# Patient Record
Sex: Female | Born: 1965 | Race: White | Hispanic: No | Marital: Married | Smoking: Never smoker
Health system: Southern US, Community
[De-identification: ages and names within clinical notes are randomized; demographics above are authoritative.]

## PROBLEM LIST (undated history)

## (undated) DIAGNOSIS — R112 Nausea with vomiting, unspecified: Secondary | ICD-10-CM

## (undated) DIAGNOSIS — Z9889 Other specified postprocedural states: Secondary | ICD-10-CM

## (undated) DIAGNOSIS — F419 Anxiety disorder, unspecified: Secondary | ICD-10-CM

## (undated) DIAGNOSIS — M419 Scoliosis, unspecified: Secondary | ICD-10-CM

## (undated) HISTORY — PX: HAND SURGERY: SHX662

## (undated) HISTORY — PX: OTHER SURGICAL HISTORY: SHX169

## (undated) HISTORY — PX: SPINE SURGERY: SHX786

## (undated) HISTORY — PX: WISDOM TOOTH EXTRACTION: SHX21

---

## 1998-05-19 ENCOUNTER — Ambulatory Visit (HOSPITAL_COMMUNITY): Admission: RE | Admit: 1998-05-19 | Discharge: 1998-05-19 | Payer: Self-pay | Admitting: Obstetrics and Gynecology

## 1998-08-16 ENCOUNTER — Inpatient Hospital Stay (HOSPITAL_COMMUNITY): Admission: AD | Admit: 1998-08-16 | Discharge: 1998-08-18 | Payer: Self-pay | Admitting: Obstetrics and Gynecology

## 1999-10-01 ENCOUNTER — Other Ambulatory Visit: Admission: RE | Admit: 1999-10-01 | Discharge: 1999-10-01 | Payer: Self-pay | Admitting: Obstetrics and Gynecology

## 2000-10-02 ENCOUNTER — Other Ambulatory Visit: Admission: RE | Admit: 2000-10-02 | Discharge: 2000-10-02 | Payer: Self-pay | Admitting: Obstetrics and Gynecology

## 2001-06-11 ENCOUNTER — Other Ambulatory Visit: Admission: RE | Admit: 2001-06-11 | Discharge: 2001-06-11 | Payer: Self-pay | Admitting: Obstetrics and Gynecology

## 2001-08-02 ENCOUNTER — Encounter: Payer: Self-pay | Admitting: Obstetrics and Gynecology

## 2001-08-02 ENCOUNTER — Ambulatory Visit (HOSPITAL_COMMUNITY): Admission: RE | Admit: 2001-08-02 | Discharge: 2001-08-02 | Payer: Self-pay | Admitting: Obstetrics and Gynecology

## 2001-12-18 ENCOUNTER — Inpatient Hospital Stay (HOSPITAL_COMMUNITY): Admission: AD | Admit: 2001-12-18 | Discharge: 2001-12-18 | Payer: Self-pay | Admitting: Obstetrics and Gynecology

## 2001-12-25 ENCOUNTER — Inpatient Hospital Stay (HOSPITAL_COMMUNITY): Admission: AD | Admit: 2001-12-25 | Discharge: 2001-12-27 | Payer: Self-pay | Admitting: Obstetrics and Gynecology

## 2001-12-25 ENCOUNTER — Encounter (INDEPENDENT_AMBULATORY_CARE_PROVIDER_SITE_OTHER): Payer: Self-pay | Admitting: Specialist

## 2002-06-25 ENCOUNTER — Other Ambulatory Visit: Admission: RE | Admit: 2002-06-25 | Discharge: 2002-06-25 | Payer: Self-pay | Admitting: Obstetrics and Gynecology

## 2003-07-21 ENCOUNTER — Other Ambulatory Visit: Admission: RE | Admit: 2003-07-21 | Discharge: 2003-07-21 | Payer: Self-pay | Admitting: Obstetrics and Gynecology

## 2004-12-27 ENCOUNTER — Other Ambulatory Visit: Admission: RE | Admit: 2004-12-27 | Discharge: 2004-12-27 | Payer: Self-pay | Admitting: Obstetrics and Gynecology

## 2005-02-16 ENCOUNTER — Ambulatory Visit: Payer: Self-pay | Admitting: Family Medicine

## 2006-03-02 ENCOUNTER — Ambulatory Visit: Payer: Self-pay | Admitting: Family Medicine

## 2006-03-06 ENCOUNTER — Ambulatory Visit: Payer: Self-pay | Admitting: Family Medicine

## 2006-03-15 ENCOUNTER — Ambulatory Visit: Payer: Self-pay | Admitting: Licensed Clinical Social Worker

## 2006-05-16 ENCOUNTER — Other Ambulatory Visit: Admission: RE | Admit: 2006-05-16 | Discharge: 2006-05-16 | Payer: Self-pay | Admitting: Obstetrics and Gynecology

## 2007-03-22 ENCOUNTER — Ambulatory Visit: Payer: Self-pay | Admitting: Family Medicine

## 2007-05-14 ENCOUNTER — Telehealth: Payer: Self-pay | Admitting: *Deleted

## 2008-03-26 ENCOUNTER — Telehealth: Payer: Self-pay | Admitting: Family Medicine

## 2008-05-02 ENCOUNTER — Ambulatory Visit: Payer: Self-pay | Admitting: Family Medicine

## 2008-05-02 DIAGNOSIS — M7710 Lateral epicondylitis, unspecified elbow: Secondary | ICD-10-CM

## 2008-05-02 DIAGNOSIS — IMO0002 Reserved for concepts with insufficient information to code with codable children: Secondary | ICD-10-CM

## 2008-05-02 DIAGNOSIS — F329 Major depressive disorder, single episode, unspecified: Secondary | ICD-10-CM | POA: Insufficient documentation

## 2008-05-02 DIAGNOSIS — K589 Irritable bowel syndrome without diarrhea: Secondary | ICD-10-CM | POA: Insufficient documentation

## 2008-05-12 ENCOUNTER — Ambulatory Visit: Payer: Self-pay | Admitting: Internal Medicine

## 2008-07-15 ENCOUNTER — Ambulatory Visit: Payer: Self-pay | Admitting: Family Medicine

## 2008-07-15 LAB — CONVERTED CEMR LAB
Bilirubin Urine: NEGATIVE
Blood in Urine, dipstick: NEGATIVE
Glucose, Urine, Semiquant: NEGATIVE
Ketones, urine, test strip: NEGATIVE
Nitrite: NEGATIVE
Protein, U semiquant: NEGATIVE
Specific Gravity, Urine: 1.02
Urobilinogen, UA: 0.2
WBC Urine, dipstick: NEGATIVE
pH: 7

## 2008-07-16 LAB — CONVERTED CEMR LAB
ALT: 14 units/L (ref 0–35)
AST: 17 units/L (ref 0–37)
Albumin: 4 g/dL (ref 3.5–5.2)
Alkaline Phosphatase: 43 units/L (ref 39–117)
BUN: 11 mg/dL (ref 6–23)
Basophils Absolute: 0 10*3/uL (ref 0.0–0.1)
Basophils Relative: 0.2 % (ref 0.0–3.0)
Bilirubin, Direct: 0.2 mg/dL (ref 0.0–0.3)
CO2: 30 meq/L (ref 19–32)
Calcium: 9 mg/dL (ref 8.4–10.5)
Chloride: 111 meq/L (ref 96–112)
Cholesterol: 162 mg/dL (ref 0–200)
Creatinine, Ser: 0.8 mg/dL (ref 0.4–1.2)
Eosinophils Absolute: 0.1 10*3/uL (ref 0.0–0.7)
Eosinophils Relative: 1.2 % (ref 0.0–5.0)
GFR calc Af Amer: 102 mL/min
GFR calc non Af Amer: 84 mL/min
Glucose, Bld: 91 mg/dL (ref 70–99)
HCT: 37 % (ref 36.0–46.0)
HDL: 45.9 mg/dL (ref >39.0)
Hemoglobin: 13.2 g/dL (ref 12.0–15.0)
LDL Cholesterol: 100 mg/dL — ABNORMAL HIGH (ref 0–99)
Lymphocytes Relative: 23.9 % (ref 12.0–46.0)
MCHC: 35.6 g/dL (ref 30.0–36.0)
MCV: 92.1 fL (ref 78.0–100.0)
Monocytes Absolute: 0.6 10*3/uL (ref 0.1–1.0)
Monocytes Relative: 10 % (ref 3.0–12.0)
Neutro Abs: 3.5 10*3/uL (ref 1.4–7.7)
Neutrophils Relative %: 64.7 % (ref 43.0–77.0)
Platelets: 172 10*3/uL (ref 150–400)
Potassium: 4.8 meq/L (ref 3.5–5.1)
RBC: 4.01 M/uL (ref 3.87–5.11)
RDW: 12.1 % (ref 11.5–14.6)
Sodium: 141 meq/L (ref 135–145)
TSH: 1.39 microintl units/mL (ref 0.35–5.50)
Total Bilirubin: 0.8 mg/dL (ref 0.3–1.2)
Total CHOL/HDL Ratio: 3.5
Total Protein: 6.9 g/dL (ref 6.0–8.3)
Triglycerides: 80 mg/dL (ref 0–149)
VLDL: 16 mg/dL (ref 0–40)
WBC: 5.5 10*3/uL (ref 4.5–10.5)

## 2008-11-14 ENCOUNTER — Ambulatory Visit: Payer: Self-pay | Admitting: Family Medicine

## 2008-11-14 DIAGNOSIS — J029 Acute pharyngitis, unspecified: Secondary | ICD-10-CM

## 2008-12-03 ENCOUNTER — Ambulatory Visit: Payer: Self-pay | Admitting: Family Medicine

## 2008-12-03 LAB — CONVERTED CEMR LAB: Rapid Strep: NEGATIVE

## 2008-12-08 LAB — CONVERTED CEMR LAB
Basophils Absolute: 0 10*3/uL (ref 0.0–0.1)
Basophils Relative: 0.2 % (ref 0.0–3.0)
EBV NA IgG: 4.38 — ABNORMAL HIGH
EBV VCA IgG: 4.27 — ABNORMAL HIGH
EBV VCA IgM: 0.15
Eosinophils Absolute: 0.1 10*3/uL (ref 0.0–0.7)
Eosinophils Relative: 1.7 % (ref 0.0–5.0)
HCT: 39 % (ref 36.0–46.0)
Hemoglobin: 13.4 g/dL (ref 12.0–15.0)
Lymphocytes Relative: 27.3 % (ref 12.0–46.0)
MCHC: 34.4 g/dL (ref 30.0–36.0)
MCV: 91.1 fL (ref 78.0–100.0)
Monocytes Absolute: 0.6 10*3/uL (ref 0.1–1.0)
Monocytes Relative: 8.5 % (ref 3.0–12.0)
Neutro Abs: 4.2 10*3/uL (ref 1.4–7.7)
Neutrophils Relative %: 62.3 % (ref 43.0–77.0)
Platelets: 195 10*3/uL (ref 150–400)
RBC: 4.28 M/uL (ref 3.87–5.11)
RDW: 11.6 % (ref 11.5–14.6)
WBC: 6.7 10*3/uL (ref 4.5–10.5)

## 2009-09-03 ENCOUNTER — Encounter (INDEPENDENT_AMBULATORY_CARE_PROVIDER_SITE_OTHER): Payer: Self-pay | Admitting: *Deleted

## 2009-11-21 ENCOUNTER — Emergency Department (HOSPITAL_COMMUNITY): Admission: EM | Admit: 2009-11-21 | Discharge: 2009-11-21 | Payer: Self-pay | Admitting: Emergency Medicine

## 2009-11-26 ENCOUNTER — Ambulatory Visit (HOSPITAL_BASED_OUTPATIENT_CLINIC_OR_DEPARTMENT_OTHER): Admission: RE | Admit: 2009-11-26 | Discharge: 2009-11-26 | Payer: Self-pay | Admitting: Orthopedic Surgery

## 2009-12-01 ENCOUNTER — Emergency Department (HOSPITAL_COMMUNITY): Admission: EM | Admit: 2009-12-01 | Discharge: 2009-12-01 | Payer: Self-pay | Admitting: Emergency Medicine

## 2010-03-30 ENCOUNTER — Telehealth: Payer: Self-pay | Admitting: Internal Medicine

## 2010-07-06 ENCOUNTER — Ambulatory Visit: Payer: Self-pay | Admitting: Family Medicine

## 2010-07-06 DIAGNOSIS — S0003XA Contusion of scalp, initial encounter: Secondary | ICD-10-CM

## 2010-07-06 DIAGNOSIS — S1093XA Contusion of unspecified part of neck, initial encounter: Secondary | ICD-10-CM

## 2010-07-06 DIAGNOSIS — G4700 Insomnia, unspecified: Secondary | ICD-10-CM | POA: Insufficient documentation

## 2010-07-06 DIAGNOSIS — S0083XA Contusion of other part of head, initial encounter: Secondary | ICD-10-CM

## 2010-07-06 DIAGNOSIS — S060X9A Concussion with loss of consciousness of unspecified duration, initial encounter: Secondary | ICD-10-CM

## 2010-11-23 NOTE — Assessment & Plan Note (Signed)
Summary: PT HIT HEAD SUNDAY/NOT FEELING/CJR   Vital Signs:  Patient profile:   45 year old female Weight:      167 pounds O2 Sat:      98 % Temp:     99 .5 degrees F Pulse rate:   92 / minute BP sitting:   110 / 76  (left arm)  Vitals Entered By: Pura Spice, RN (July 06, 2010 3:16 PM) CC: hit left frontal head on car door sunday. No LOC but doesn't feel well   History of Present Illness: Here for 2 reasons. First 3 days ago while getting into her car she struck her forehead on the edge of the door. It did not knock her out, but she felt lightheaded for a few minutes. Since then she has had a slight HA and has felt slightly off balance. No vision changes or nausea. No other neurologic deficits. She hasbben working and driving this week. Second, she has been under a lot of stress over the past year. She is separated from her husband, and she has felt quite anxious. She has had trouble sleeping and would like some help with this.   Allergies: 1)  ! Sulf-10  Past History:  Past Medical History: Reviewed history from 05/02/2008 and no changes required. IBS Lt foot fracture Depression  Review of Systems  The patient denies anorexia, fever, weight loss, weight gain, vision loss, decreased hearing, hoarseness, chest pain, syncope, dyspnea on exertion, peripheral edema, prolonged cough, hemoptysis, abdominal pain, melena, hematochezia, severe indigestion/heartburn, hematuria, incontinence, genital sores, muscle weakness, suspicious skin lesions, transient blindness, difficulty walking, depression, unusual weight change, abnormal bleeding, enlarged lymph nodes, angioedema, breast masses, and testicular masses.    Physical Exam  General:  Well-developed,well-nourished,in no acute distress; alert,appropriate and cooperative throughout examination Head:  small tender hematoma on the left forehead Eyes:  No corneal or conjunctival inflammation noted. EOMI. Perrla. Funduscopic exam  benign, without hemorrhages, exudates or papilledema. Vision grossly normal. Ears:  External ear exam shows no significant lesions or deformities.  Otoscopic examination reveals clear canals, tympanic membranes are intact bilaterally without bulging, retraction, inflammation or discharge. Hearing is grossly normal bilaterally. Nose:  External nasal examination shows no deformity or inflammation. Nasal mucosa are pink and moist without lesions or exudates. Mouth:  Oral mucosa and oropharynx without lesions or exudates.  Teeth in good repair. Neck:  No deformities, masses, or tenderness noted. Neurologic:  No cranial nerve deficits noted. Station and gait are normal. Plantar reflexes are down-going bilaterally. DTRs are symmetrical throughout. Sensory, motor and coordinative functions appear intact. Psych:  Cognition and judgment appear intact. Alert and cooperative with normal attention span and concentration. No apparent delusions, illusions, hallucinations   Impression & Recommendations:  Problem # 1:  INSOMNIA (ICD-780.52)  Her updated medication list for this problem includes:    Temazepam 15 Mg Caps (Temazepam) .Marland Kitchen... At bedtime  Complete Medication List: 1)  Temazepam 15 Mg Caps (Temazepam) .... At bedtime  Patient Instructions: 1)  She has had a mild concussion. She should do well if she takes it easy this week. Follow up as needed . Try Temazepam for sleep. Prescriptions: TEMAZEPAM 15 MG CAPS (TEMAZEPAM) at bedtime  #30 x 2   Entered and Authorized by:   Nelwyn Salisbury MD   Signed by:   Nelwyn Salisbury MD on 07/06/2010   Method used:   Print then Give to Patient   RxID:   [id number]

## 2010-11-23 NOTE — Progress Notes (Signed)
Summary: new rx   Phone Note Call from Patient Call back at Work Phone [phone]   Caller: Patient--live call Summary of Call: pt says that she is on xanax low dose. please call a new rx to [facility] on [address]. pt is aware that Dr Clent Ridges is out. She wants to know if any doctor could fill this. Please call to let her know yes or no.  Initial call taken by: Warnell Forester,  March 30, 2010 4:09 PM  Follow-up for Phone Call        review of record shows she has not  had an OV  isn over a year and xanax is not on her med list . Perhaps she got thisf roma another doctor.    she needs appt with Dr Clent Ridges  to discuss this Follow-up by: Madelin Headings MD,  March 31, 2010 3:10 PM  Additional Follow-up for Phone Call Additional follow up Details #1::        Phone Call Completed Additional Follow-up by: Raechel Ache, RN,  March 31, 2010 3:24 PM

## 2011-01-13 LAB — POCT HEMOGLOBIN-HEMACUE: Hemoglobin: 14 g/dL (ref 12.0–15.0)

## 2011-01-18 ENCOUNTER — Ambulatory Visit (INDEPENDENT_AMBULATORY_CARE_PROVIDER_SITE_OTHER): Payer: BC Managed Care – PPO | Admitting: Family Medicine

## 2011-01-18 ENCOUNTER — Encounter: Payer: Self-pay | Admitting: Family Medicine

## 2011-01-18 VITALS — BP 110/64 | HR 100 | Temp 98.7°F

## 2011-01-18 DIAGNOSIS — IMO0002 Reserved for concepts with insufficient information to code with codable children: Secondary | ICD-10-CM

## 2011-01-18 MED ORDER — DOXYCYCLINE HYCLATE 100 MG PO CAPS: 100.0000 mg | ORAL_CAPSULE | Freq: Two times a day (BID) | ORAL | Status: AC

## 2011-01-18 NOTE — Progress Notes (Signed)
  Subjective:    Patient ID: Karen Flores, female    DOB: [birth year], 45 y.o.   MRN: [id number]  HPI Here for 4 weeks of recurrent infections around her fingernails. The first area involved was the left 3rd finger, where the skin at the base of the nail became swollen, red, and painful. She went to a [facility] and was given 5 days of Clindamycin. This did not help, and the finger has been smoldering ever since. Then one week ago the same thing happened tho the right 3rd finger. She went to the [facility] again this morning and was given a Zpack. She is here to get a second opinion from me.    Review of Systems  Constitutional: Negative.   Musculoskeletal: Positive for joint swelling.  Skin: Positive for color change.       Objective:   Physical Exam  Constitutional: She appears well-developed and well-nourished.  Skin:       The skin at the base of both 3rd fingernails is red, swollen, and tender. The left 3rd nail is distorted and has a scooped out appearance           Assessment & Plan:  Do not take the Zpack, use Doxycycline instead. Warm soaks.

## 2011-03-11 NOTE — Discharge Summary (Signed)
[facility]  Patient:    Karen Flores, Karen Flores Visit Number: [id number] MRN: [id number]          Service Type: OBS Location: [id number] Attending Physician:  Oliver Pila Dictated by:   Malachi Pro. Ambrose Mantle, M.D. Admit Date:  12/25/2001 Discharge Date: 12/27/2001                             Discharge Summary  HISTORY OF PRESENT ILLNESS:  The patient is a 45 year old white female, para 1-0-0-1, gravida 2, at 39+ weeks gestation with an estimated date of confinement of 12/29/01, presented to labor and delivery with contractions every three minutes for two hours.  Pregnancy was uncomplicated.  She had a history of mild shoulder dystocia.  LABORATORY DATA:  Blood group and type was A+ with a negative antibody, nonreactive serology, rubella immune.  Hepatitis B surface antigen negative. HIV negative.  GC and chlamydia negative.  Triple screen normal.  Group B Strep negative.  PAST OBSTETRICAL HISTORY:  Low forceps delivery in 1999, with an 8 pound 1 ounce infant with mild shoulder dystocia.  PAST GYNECOLOGICAL HISTORY:  Negative.  PAST SURGICAL HISTORY:  Broken arm.  PAST MEDICAL HISTORY:  Irritable bowel.  ADMISSION PHYSICAL EXAMINATION:  VITAL SIGNS:  Normal.  Fetal heart tones were reassuring.  Contractions were every 3 minutes.  PELVIC:  Cervix was 9 cm, with a bulging bag on admission.  HOSPITAL COURSE:  Artificial rupture of membranes was done on admission with clear fluid.  At 10 cm dilatation, the patient began pushing, and told Dr. Senaida Ores that she was very uncomfortable and could not go on pushing.  She was given an epidural, and with the epidural the vertex descended.  She brought the vertex to the perineum in a LOA position.  She stalled with lots of capit showing, and stated "I want the baby out."  A left medial lateral episiotomy in the old scar was done, and with one contraction the patient delivered a living female infant 8  pounds 2 ounces, with Apgars of 8 at one and 9 at five minutes.  There was a tight loop of nuchal cord.  The placenta was intact, uterus normal, rectal negative, left medial lateral episiotomy repaired with 2-0 Vicryl.  Blood loss was about 400 cc.  Dr. Ambrose Mantle was in attendance.  Postpartum the patient did very well, and was discharged on the second postpartum day.  Initial hemoglobin was 10.8, hematocrit 31.2, white blood cell count 8300, platelet count 153,000.  Followup hemoglobin 9.2, hematocrit 26,500, white blood cell count 9700, platelet count 132,000.  RPR was nonreactive.  FINAL DIAGNOSIS:  Intrauterine pregnancy at 39+ weeks, delivered LOA.  OPERATIONS: 1. Spontaneous delivery LOA. 2. Left medial lateral episiotomy and repair.  FINAL CONDITION:  Improved.  DISCHARGE INSTRUCTIONS:  Our regular discharge instruction booklet.  FOLLOWUP:  The patient is advised to return in six weeks for follow-up examination.  She declines analgesics at discharge. Dictated by:   Malachi Pro. Ambrose Mantle, M.D. Attending Physician:  Oliver Pila DD:  12/27/01 TD:  12/28/01 Job: [id number]

## 2011-03-11 NOTE — Assessment & Plan Note (Signed)
[facility]                                   ON-CALL NOTE   Flores, Karen                       MRN:          [id number]  DATE:08/12/2006                            DOB:          1966-08-25    DATE AND TIME OF CALL:  August 12, 2006 at approximately 8:30   PRIMARY CARE DOCTOR:  Dr. Clent Ridges   PHONE NUMBER:  [phone]   SUBJECTIVE:  Karen Flores states that she fell last evening on one of her  feet and since that time she has had pain in her foot and difficulty  weightbearing.  When the injury occurred, she heard a pop.   ASSESSMENT:  The patient was offered an appointment at 11 o'clock at the  Urgent Care.  Because we have to send her for x-rays, she is also going to  consider going to a different Urgent Care where they have x-rays on site.  We will hold an appointment for her at 13 though.       Kerby Nora, MD      AB/MedQ  DD:  08/12/2006  DT:  08/14/2006  Job #:  [id number]   cc:   Tera Mater. Clent Ridges, MD

## 2011-11-14 MED ORDER — SCOPOLAMINE 1 MG/3DAYS TD PT72
MEDICATED_PATCH | TRANSDERMAL | Status: AC
  Filled 2011-11-14: qty 1

## 2011-11-14 MED ORDER — KETOROLAC TROMETHAMINE 60 MG/2ML IM SOLN
INTRAMUSCULAR | Status: AC
  Filled 2011-11-14: qty 2

## 2011-11-15 ENCOUNTER — Encounter (HOSPITAL_COMMUNITY): Payer: Self-pay | Admitting: Pharmacist

## 2011-11-23 ENCOUNTER — Encounter (HOSPITAL_COMMUNITY): Payer: Self-pay

## 2011-11-23 ENCOUNTER — Encounter (HOSPITAL_COMMUNITY)
Admission: RE | Admit: 2011-11-23 | Discharge: 2011-11-23 | Disposition: A | Discharge status: Home / Self Care | Payer: BC Managed Care – PPO | Source: Ambulatory Visit | Attending: Obstetrics and Gynecology | Admitting: Obstetrics and Gynecology

## 2011-11-23 HISTORY — DX: Other specified postprocedural states: Z98.890

## 2011-11-23 HISTORY — DX: Anxiety disorder, unspecified: F41.9

## 2011-11-23 HISTORY — DX: Other specified postprocedural states: R11.2

## 2011-11-23 HISTORY — DX: Scoliosis, unspecified: M41.9

## 2011-11-23 HISTORY — DX: Nausea with vomiting, unspecified: Z98.890

## 2011-11-23 LAB — CBC
MCV: 89.7 fL (ref 78.0–100.0)
Platelets: 215 10*3/uL (ref 150–400)
RBC: 4.38 MIL/uL (ref 3.87–5.11)
WBC: 6.2 10*3/uL (ref 4.0–10.5)

## 2011-11-23 NOTE — Patient Instructions (Addendum)
    Your procedure is scheduled on: TUESDAY, FEB. 5TH  Enter through the Main Entrance of [facility] at:  6am Pick up the phone at the desk and dial [phone] and inform us of your arrival.  Please call this number if you have any problems the morning of surgery: [phone]  Remember: Do not eat food after midnight: Monday Do not drink clear liquids after: Monday Take these medicines the morning of surgery with a SIP OF WATER: Xanax  Do not wear jewelry, make-up, or FINGER nail polish Do not wear lotions, powders, perfumes or deodorant. Do not shave 48 hours prior to surgery. Do not bring valuables to the hospital.  Leave suitcase in the car. After Surgery it may be brought to your room. For patients being admitted to the hospital, checkout time is 11:00am the day of discharge. Home with Mother Ernest Haber   Patients discharged on the day of surgery will not be allowed to drive home.     Remember to use your hibiclens as instructed.Please shower with 1/2 bottle the evening before your surgery and the other 1/2 bottle the morning of surgery.

## 2011-11-23 NOTE — Pre-Procedure Instructions (Signed)
Ok to see patient DOS. 

## 2011-11-29 ENCOUNTER — Encounter (HOSPITAL_COMMUNITY): Payer: Self-pay | Admitting: Anesthesiology

## 2011-11-29 ENCOUNTER — Encounter (HOSPITAL_COMMUNITY): Payer: Self-pay | Admitting: *Deleted

## 2011-11-29 ENCOUNTER — Ambulatory Visit (HOSPITAL_COMMUNITY)
Admission: RE | Admit: 2011-11-29 | Discharge: 2011-11-30 | Disposition: A | Discharge status: Home / Self Care | Payer: BC Managed Care – PPO | Source: Ambulatory Visit | Attending: Obstetrics and Gynecology | Admitting: Obstetrics and Gynecology

## 2011-11-29 ENCOUNTER — Other Ambulatory Visit: Payer: Self-pay | Admitting: Obstetrics and Gynecology

## 2011-11-29 ENCOUNTER — Encounter (HOSPITAL_COMMUNITY)
Admission: RE | Disposition: A | Discharge status: Home / Self Care | Payer: Self-pay | Source: Ambulatory Visit | Attending: Obstetrics and Gynecology

## 2011-11-29 ENCOUNTER — Ambulatory Visit (HOSPITAL_COMMUNITY): Payer: BC Managed Care – PPO | Admitting: Anesthesiology

## 2011-11-29 DIAGNOSIS — Z01812 Encounter for preprocedural laboratory examination: Secondary | ICD-10-CM | POA: Insufficient documentation

## 2011-11-29 DIAGNOSIS — D259 Leiomyoma of uterus, unspecified: Secondary | ICD-10-CM | POA: Insufficient documentation

## 2011-11-29 DIAGNOSIS — D219 Benign neoplasm of connective and other soft tissue, unspecified: Secondary | ICD-10-CM

## 2011-11-29 DIAGNOSIS — Z01818 Encounter for other preprocedural examination: Secondary | ICD-10-CM | POA: Insufficient documentation

## 2011-11-29 HISTORY — PX: LAPAROSCOPIC SUPRACERVICAL HYSTERECTOMY: SHX5399

## 2011-11-29 LAB — CBC
MCHC: 33.3 g/dL (ref 30.0–36.0)
MCV: 89.6 fL (ref 78.0–100.0)
Platelets: 157 10*3/uL (ref 150–400)
RDW: 12.6 % (ref 11.5–15.5)
WBC: 7.2 10*3/uL (ref 4.0–10.5)

## 2011-11-29 LAB — HCG, QUANTITATIVE, PREGNANCY: hCG, Beta Chain, Quant, S: <1 m[IU]/mL (ref <5)

## 2011-11-29 SURGERY — HYSTERECTOMY, SUPRACERVICAL, LAPAROSCOPIC
Anesthesia: General | Site: Abdomen | Wound class: Clean Contaminated

## 2011-11-29 MED ORDER — MIDAZOLAM HCL 5 MG/5ML IJ SOLN
INTRAMUSCULAR | Status: DC | PRN
  Administered 2011-11-29: 2 mg via INTRAVENOUS

## 2011-11-29 MED ORDER — FENTANYL CITRATE 0.05 MG/ML IJ SOLN
INTRAMUSCULAR | Status: AC
  Filled 2011-11-29: qty 2

## 2011-11-29 MED ORDER — ROCURONIUM BROMIDE 50 MG/5ML IV SOLN
INTRAVENOUS | Status: AC
  Filled 2011-11-29: qty 1

## 2011-11-29 MED ORDER — ALUM & MAG HYDROXIDE-SIMETH 200-200-20 MG/5ML PO SUSP
30.0000 mL | ORAL | Status: DC | PRN
  Administered 2011-11-29: 30 mL via ORAL
  Filled 2011-11-29: qty 30

## 2011-11-29 MED ORDER — GLYCOPYRROLATE 0.2 MG/ML IJ SOLN
INTRAMUSCULAR | Status: DC | PRN
  Administered 2011-11-29: .8 mg via INTRAVENOUS
  Administered 2011-11-29: 0.1 mg via INTRAVENOUS
  Administered 2011-11-29: 0.2 mg via INTRAVENOUS

## 2011-11-29 MED ORDER — SIMETHICONE 80 MG PO CHEW
80.0000 mg | CHEWABLE_TABLET | Freq: Four times a day (QID) | ORAL | Status: DC | PRN
  Administered 2011-11-29: 80 mg via ORAL

## 2011-11-29 MED ORDER — GLYCOPYRROLATE 0.2 MG/ML IJ SOLN
INTRAMUSCULAR | Status: AC
  Filled 2011-11-29: qty 2

## 2011-11-29 MED ORDER — SCOPOLAMINE 1 MG/3DAYS TD PT72
1.0000 | MEDICATED_PATCH | Freq: Once | TRANSDERMAL | Status: DC
  Administered 2011-11-29: 1.5 mg via TRANSDERMAL

## 2011-11-29 MED ORDER — KETOROLAC TROMETHAMINE 30 MG/ML IJ SOLN: 30.0000 mg | Freq: Four times a day (QID) | INTRAMUSCULAR | Status: DC

## 2011-11-29 MED ORDER — DEXAMETHASONE SODIUM PHOSPHATE 10 MG/ML IJ SOLN
INTRAMUSCULAR | Status: AC
  Filled 2011-11-29: qty 1

## 2011-11-29 MED ORDER — HYDROMORPHONE HCL PF 1 MG/ML IJ SOLN
INTRAMUSCULAR | Status: AC
  Filled 2011-11-29: qty 1

## 2011-11-29 MED ORDER — PANTOPRAZOLE SODIUM 40 MG PO TBEC
40.0000 mg | DELAYED_RELEASE_TABLET | Freq: Once | ORAL | Status: AC
  Administered 2011-11-29: 40 mg via ORAL

## 2011-11-29 MED ORDER — NEOSTIGMINE METHYLSULFATE 1 MG/ML IJ SOLN
INTRAMUSCULAR | Status: AC
  Filled 2011-11-29: qty 10

## 2011-11-29 MED ORDER — ONDANSETRON HCL 4 MG/2ML IJ SOLN
INTRAMUSCULAR | Status: DC | PRN
  Administered 2011-11-29: 4 mg via INTRAVENOUS

## 2011-11-29 MED ORDER — LACTATED RINGERS IV SOLN
INTRAVENOUS | Status: DC
  Administered 2011-11-29 (×3): via INTRAVENOUS

## 2011-11-29 MED ORDER — OXYCODONE-ACETAMINOPHEN 5-325 MG PO TABS
1.0000 | ORAL_TABLET | ORAL | Status: DC | PRN
  Administered 2011-11-29 (×3): 2 via ORAL
  Administered 2011-11-30: 1 via ORAL
  Filled 2011-11-29 (×4): qty 2

## 2011-11-29 MED ORDER — PANTOPRAZOLE SODIUM 40 MG PO TBEC
DELAYED_RELEASE_TABLET | ORAL | Status: AC
  Filled 2011-11-29: qty 1

## 2011-11-29 MED ORDER — PROPOFOL 10 MG/ML IV EMUL
INTRAVENOUS | Status: AC
  Filled 2011-11-29: qty 20

## 2011-11-29 MED ORDER — DEXAMETHASONE SODIUM PHOSPHATE 10 MG/ML IJ SOLN
INTRAMUSCULAR | Status: DC | PRN
  Administered 2011-11-29: 10 mg via INTRAVENOUS

## 2011-11-29 MED ORDER — LIDOCAINE HCL (CARDIAC) 20 MG/ML IV SOLN
INTRAVENOUS | Status: DC | PRN
  Administered 2011-11-29: 60 mg via INTRAVENOUS

## 2011-11-29 MED ORDER — CEFAZOLIN SODIUM 1-5 GM-% IV SOLN: 1.0000 g | INTRAVENOUS | Status: DC

## 2011-11-29 MED ORDER — NEOSTIGMINE METHYLSULFATE 1 MG/ML IJ SOLN
INTRAMUSCULAR | Status: DC | PRN
  Administered 2011-11-29: 4 mg via INTRAVENOUS

## 2011-11-29 MED ORDER — BUPIVACAINE HCL (PF) 0.25 % IJ SOLN
INTRAMUSCULAR | Status: AC
  Filled 2011-11-29: qty 30

## 2011-11-29 MED ORDER — ALPRAZOLAM 0.25 MG PO TABS: 0.2500 mg | ORAL_TABLET | Freq: Every day | ORAL | Status: DC | PRN

## 2011-11-29 MED ORDER — SODIUM CHLORIDE 0.9 % IJ SOLN
INTRAMUSCULAR | Status: DC | PRN
  Administered 2011-11-29: 10 mL

## 2011-11-29 MED ORDER — MIDAZOLAM HCL 2 MG/2ML IJ SOLN
INTRAMUSCULAR | Status: AC
  Filled 2011-11-29: qty 2

## 2011-11-29 MED ORDER — HYDROMORPHONE HCL PF 1 MG/ML IJ SOLN
0.2500 mg | INTRAMUSCULAR | Status: DC | PRN
  Administered 2011-11-29: 50 mg via INTRAVENOUS
  Administered 2011-11-29: 11:00:00 via INTRAVENOUS

## 2011-11-29 MED ORDER — LACTATED RINGERS IR SOLN
Status: DC | PRN
  Administered 2011-11-29: 3000 mL

## 2011-11-29 MED ORDER — FENTANYL CITRATE 0.05 MG/ML IJ SOLN
INTRAMUSCULAR | Status: DC | PRN
  Administered 2011-11-29: 100 ug via INTRAVENOUS
  Administered 2011-11-29 (×5): 50 ug via INTRAVENOUS

## 2011-11-29 MED ORDER — ONDANSETRON HCL 4 MG PO TABS: 4.0000 mg | ORAL_TABLET | Freq: Four times a day (QID) | ORAL | Status: DC | PRN

## 2011-11-29 MED ORDER — LIDOCAINE HCL (CARDIAC) 20 MG/ML IV SOLN
INTRAVENOUS | Status: AC
  Filled 2011-11-29: qty 5

## 2011-11-29 MED ORDER — ONDANSETRON HCL 4 MG/2ML IJ SOLN: 4.0000 mg | Freq: Four times a day (QID) | INTRAMUSCULAR | Status: DC | PRN

## 2011-11-29 MED ORDER — HYDROMORPHONE HCL PF 1 MG/ML IJ SOLN
INTRAMUSCULAR | Status: DC | PRN
  Administered 2011-11-29 (×2): 1 mg via INTRAVENOUS

## 2011-11-29 MED ORDER — DEXTROSE-NACL 5-0.45 % IV SOLN
INTRAVENOUS | Status: DC
  Administered 2011-11-29 – 2011-11-30 (×2): via INTRAVENOUS

## 2011-11-29 MED ORDER — KETOROLAC TROMETHAMINE 30 MG/ML IJ SOLN
30.0000 mg | Freq: Four times a day (QID) | INTRAMUSCULAR | Status: DC
  Administered 2011-11-29 – 2011-11-30 (×3): 30 mg via INTRAVENOUS
  Filled 2011-11-29 (×3): qty 1

## 2011-11-29 MED ORDER — ONDANSETRON HCL 4 MG/2ML IJ SOLN
INTRAMUSCULAR | Status: AC
  Filled 2011-11-29: qty 2

## 2011-11-29 MED ORDER — INFLUENZA VIRUS VACC SPLIT PF IM SUSP: 0.5000 mL | INTRAMUSCULAR | Status: DC | PRN

## 2011-11-29 MED ORDER — FENTANYL CITRATE 0.05 MG/ML IJ SOLN
INTRAMUSCULAR | Status: AC
  Filled 2011-11-29: qty 5

## 2011-11-29 MED ORDER — BUPIVACAINE HCL (PF) 0.25 % IJ SOLN
INTRAMUSCULAR | Status: DC | PRN
  Administered 2011-11-29: 10 mL

## 2011-11-29 MED ORDER — SCOPOLAMINE 1 MG/3DAYS TD PT72
MEDICATED_PATCH | TRANSDERMAL | Status: AC
  Filled 2011-11-29: qty 1

## 2011-11-29 MED ORDER — CEFAZOLIN SODIUM 1-5 GM-% IV SOLN
INTRAVENOUS | Status: AC
  Administered 2011-11-29: 1 g via INTRAVENOUS
  Filled 2011-11-29: qty 50

## 2011-11-29 MED ORDER — PROPOFOL 10 MG/ML IV EMUL
INTRAVENOUS | Status: DC | PRN
  Administered 2011-11-29: 150 mg via INTRAVENOUS

## 2011-11-29 MED ORDER — KETOROLAC TROMETHAMINE 30 MG/ML IJ SOLN
INTRAMUSCULAR | Status: AC
  Filled 2011-11-29: qty 1

## 2011-11-29 MED ORDER — KETOROLAC TROMETHAMINE 30 MG/ML IJ SOLN
INTRAMUSCULAR | Status: DC | PRN
  Administered 2011-11-29: 30 mg via INTRAVENOUS

## 2011-11-29 MED ORDER — ROCURONIUM BROMIDE 100 MG/10ML IV SOLN
INTRAVENOUS | Status: DC | PRN
  Administered 2011-11-29: 40 mg via INTRAVENOUS
  Administered 2011-11-29: 10 mg via INTRAVENOUS
  Administered 2011-11-29: 20 mg via INTRAVENOUS
  Administered 2011-11-29: 10 mg via INTRAVENOUS

## 2011-11-29 SURGICAL SUPPLY — 33 items
ADH SKN CLS APL DERMABOND .7 (GAUZE/BANDAGES/DRESSINGS) ×1
BARRIER ADHS 3X4 INTERCEED (GAUZE/BANDAGES/DRESSINGS) ×1 IMPLANT
BLADE LAPAROSCOPIC MORCELL KIT (BLADE) ×2 IMPLANT
BLADE SURG 10 STRL SS (BLADE) ×1 IMPLANT
BRR ADH 4X3 ABS CNTRL BYND (GAUZE/BANDAGES/DRESSINGS) ×1
CABLE HIGH FREQUENCY MONO STRZ (ELECTRODE) IMPLANT
CHLORAPREP W/TINT 26ML (MISCELLANEOUS) ×2 IMPLANT
CLOTH BEACON ORANGE TIMEOUT ST (SAFETY) ×2 IMPLANT
COVER MAYO STAND STRL (DRAPES) ×2 IMPLANT
DERMABOND ADVANCED (GAUZE/BANDAGES/DRESSINGS) ×1
DERMABOND ADVANCED .7 DNX12 (GAUZE/BANDAGES/DRESSINGS) ×1 IMPLANT
EVACUATOR SMOKE 8.L (FILTER) ×4 IMPLANT
GLOVE BIO SURGEON STRL SZ8 (GLOVE) ×2 IMPLANT
GLOVE ORTHO TXT STRL SZ7.5 (GLOVE) ×2 IMPLANT
GOWN PREVENTION PLUS LG XLONG (DISPOSABLE) ×4 IMPLANT
HEMOSTAT SURGICEL 2X3 (HEMOSTASIS) ×1 IMPLANT
NDL INSUFFLATION 14GA 120MM (NEEDLE) ×1 IMPLANT
NEEDLE INSUFFLATION 14GA 120MM (NEEDLE) ×2 IMPLANT
NS IRRIG 1000ML POUR BTL (IV SOLUTION) ×2 IMPLANT
PACK LAPAROSCOPY BASIN (CUSTOM PROCEDURE TRAY) ×2 IMPLANT
SCALPEL HARMONIC ACE (MISCELLANEOUS) ×1 IMPLANT
SET IRRIG TUBING LAPAROSCOPIC (IRRIGATION / IRRIGATOR) ×2 IMPLANT
SLEEVE Z-THREAD 5X100MM (TROCAR) ×2 IMPLANT
SOLUTION ELECTROLUBE (MISCELLANEOUS) ×1 IMPLANT
SUT VIC AB 3-0 PS2 18 (SUTURE) ×2
SUT VIC AB 3-0 PS2 18XBRD (SUTURE) ×1 IMPLANT
SUT VICRYL 0 UR6 27IN ABS (SUTURE) ×2 IMPLANT
TOWEL OR 17X24 6PK STRL BLUE (TOWEL DISPOSABLE) ×4 IMPLANT
TRAY FOLEY CATH 14FR (SET/KITS/TRAYS/PACK) ×2 IMPLANT
TROCAR Z-THREAD FIOS 12X100MM (TROCAR) ×2 IMPLANT
TROCAR Z-THREAD FIOS 5X100MM (TROCAR) ×2 IMPLANT
WARMER LAPAROSCOPE (MISCELLANEOUS) ×2 IMPLANT
WATER STERILE IRR 1000ML POUR (IV SOLUTION) ×2 IMPLANT

## 2011-11-29 NOTE — Progress Notes (Addendum)
Post-op check, LSH Doing ok, moderate pain, some nausea Afeb, VSS Abd- soft, incisions ok Hgb 13.1 to 10.9 Will keep overnight for pain control, recheck CBC in am

## 2011-11-29 NOTE — Anesthesia Procedure Notes (Signed)
Procedure Name: Intubation Date/Time: 11/29/2011 7:30 AM Performed by: Karleen Dolphin Pre-anesthesia Checklist: Timeout performed, Patient identified, Emergency Drugs available, Suction available and Patient being monitored Patient Re-evaluated:Patient Re-evaluated prior to inductionOxygen Delivery Method: Circle System Utilized Preoxygenation: Pre-oxygenation with 100% oxygen Intubation Type: IV induction Ventilation: Mask ventilation without difficulty Laryngoscope Size: Mac and 3 Grade View: Grade I Tube type: Oral Tube size: 7.0 mm Number of attempts: 1 Airway Equipment and Method: stylet Placement Confirmation: ETT inserted through vocal cords under direct vision,  breath sounds checked- equal and bilateral and positive ETCO2 Secured at: 21 cm Tube secured with: Tape Dental Injury: Teeth and Oropharynx as per pre-operative assessment

## 2011-11-29 NOTE — Anesthesia Postprocedure Evaluation (Signed)
Anesthesia Post Note  Patient: Karen Flores  Procedure(s) Performed:  LAPAROSCOPIC SUPRACERVICAL HYSTERECTOMY  Anesthesia type: GA  Patient location: PACU  Post pain: Pain level controlled  Post assessment: Post-op Vital signs reviewed  Last Vitals:  Filed Vitals:   11/29/11 1020  BP: 103/54  Pulse: 67  Temp: 36.6 C  Resp: 14    Post vital signs: Reviewed  Level of consciousness: sedated  Complications: No apparent anesthesia complications

## 2011-11-29 NOTE — Anesthesia Postprocedure Evaluation (Signed)
  Anesthesia Post-op Note  Patient: Karen Flores  Procedure(s) Performed:  LAPAROSCOPIC SUPRACERVICAL HYSTERECTOMY  Patient Location: Women's Unit  Anesthesia Type: General  Level of Consciousness: awake, alert  and oriented  Airway and Oxygen Therapy: Patient Spontanous Breathing  Post-op Pain: mild  Post-op Assessment: Patient's Cardiovascular Status Stable, Respiratory Function Stable, Patent Airway, No signs of Nausea or vomiting and Pain level controlled  Post-op Vital Signs: stable  Complications: No apparent anesthesia complications

## 2011-11-29 NOTE — H&P (Signed)
Karen Flores is an 46 y.o. female, P 2012, with a symptomatic fibroid admitted for surgical therapy. She was seen for an annual exam this past October, was found to have an enlarged uterus pushing on her bladder.  Pelvic ultrasound confirmed an 8x5 cm anterior myoma.  She then had an unplanned pregnancy with termination in November, and is now ready for definitive surgical therapy for this symptomatic fibroid.    Pertinent Gynecological History: Last mammogram: normal Date: 2 years ago Last pap: normal Date: 2 years ago OB History: G3, P2012   Menstrual History: No LMP recorded.    Past Medical History  Diagnosis Date  . PONV (postoperative nausea and vomiting)   . Anxiety   . Scoliosis   Genital HSV Depression OAB IBS No history of abnormal Pap smear   Past Surgical History  Procedure Date  . Hand surgery as a child and in 11/2009     x 2, Left wrist surgery with plates and screws  . Wisdom tooth extraction   . Svd     x 2  . Colonscopy     History reviewed. No pertinent family history.  Social History:  reports that she has never smoked. She has never used smokeless tobacco. She reports that she drinks alcohol. She reports that she does not use illicit drugs.  Allergies:  Allergies  Allergen Reactions  . Sulfacetamide Sodium Hives    Prescriptions prior to admission  Medication Sig Dispense Refill  . ALPRAZolam (XANAX) 0.25 MG tablet Take 0.25 mg by mouth daily as needed. For anxiety or nervousness      . Desogestrel-Ethinyl Estradiol (CYCLESSA PO) Take 1 tablet by mouth daily. For bleeding      . ibuprofen (ADVIL,MOTRIN) 200 MG tablet Take 600 mg by mouth daily as needed. For pain        Review of Systems  Respiratory: Negative.   Cardiovascular: Negative.   Gastrointestinal: Negative.   Genitourinary: Positive for urgency and frequency. Negative for dysuria.    Blood pressure 126/82, pulse 84, temperature 98.1 F (36.7 C), temperature source Oral, resp.  rate 16, SpO2 99.00%. Physical Exam  Constitutional: She appears well-developed and well-nourished.  Neck: Neck supple. No thyromegaly present.  Cardiovascular: Normal rate, regular rhythm and normal heart sounds.   No murmur heard. Respiratory: Effort normal and breath sounds normal. No respiratory distress. She has no wheezes.  GI: Soft. She exhibits no distension and no mass. There is no tenderness.  Genitourinary: Vagina normal.       EGBUS- no lesions Uterus slightly enlarged and irregular, pushing on bladder Adnexa- No mass or pain    Results for orders placed during the Flores encounter of 11/29/11 (from the past 24 hour(s))  HCG, QUANTITATIVE, PREGNANCY     Status: Normal   Collection Time   11/29/11  6:10 AM      Component Value Range   hCG, Beta Chain, Quant, S <1  <5 (mIU/mL)    No results found.  Assessment/Plan: Symptomatic myomatous uterus.  All medical and surgical options have been discussed, she wants definitive surgical therapy.  Hysterectomy procedure, risks, chances of achieving goals of relieving pressure from fibroid have all been discussed.  Will admit for Karen Flores.  Karen Flores 11/29/2011, 7:04 AM

## 2011-11-29 NOTE — Transfer of Care (Signed)
Immediate Anesthesia Transfer of Care Note  Patient: Karen Flores  Procedure(s) Performed:  LAPAROSCOPIC SUPRACERVICAL HYSTERECTOMY  Patient Location: PACU  Anesthesia Type: General  Level of Consciousness: awake, alert  and oriented  Airway & Oxygen Therapy: Patient Spontanous Breathing and Patient connected to nasal cannula oxygen  Post-op Assessment: Report given to PACU RN and Post -op Vital signs reviewed and stable  Post vital signs: Reviewed and stable  Complications: No apparent anesthesia complications

## 2011-11-29 NOTE — Anesthesia Preprocedure Evaluation (Signed)
Anesthesia Evaluation  Patient identified by MRN, date of birth, ID band Patient awake    Reviewed: Allergy & Precautions, H&P , Patient's Chart, lab work & pertinent test results, reviewed documented beta blocker date and time   History of Anesthesia Complications (+) PONV  Airway Mallampati: II TM Distance: >3 FB Neck ROM: full    Dental No notable dental hx.    Pulmonary  clear to auscultation  Pulmonary exam normal       Cardiovascular regular Normal    Neuro/Psych    GI/Hepatic   Endo/Other    Renal/GU      Musculoskeletal   Abdominal   Peds  Hematology   Anesthesia Other Findings   Reproductive/Obstetrics                           Anesthesia Physical Anesthesia Plan  ASA: II  Anesthesia Plan: General   Post-op Pain Management:    Induction: Intravenous  Airway Management Planned: Oral ETT  Additional Equipment:   Intra-op Plan:   Post-operative Plan:   Informed Consent: I have reviewed the patients History and Physical, chart, labs and discussed the procedure including the risks, benefits and alternatives for the proposed anesthesia with the patient or authorized representative who has indicated his/her understanding and acceptance.   Dental Advisory Given and Dental advisory given  Plan Discussed with: CRNA and Surgeon  Anesthesia Plan Comments: (  Discussed  general anesthesia, including possible nausea, instrumentation of airway, sore throat,pulmonary aspiration, etc. I asked if the were any outstanding questions, or  concerns before we proceeded. )        Anesthesia Quick Evaluation

## 2011-11-29 NOTE — Progress Notes (Signed)
Date of Initial H&P: 11-29-11  History reviewed, patient examined, no change in status, stable for surgery.

## 2011-11-29 NOTE — Op Note (Signed)
Preoperative diagnosis: Symptomatic fibroid uterus Postoperative diagnosis: Same Procedure: Laparoscopic supracervical hysterectomy Surgeon: Lavina Hamman M.D. Assistant: Huel Cote M.D. Anesthesia: Gen. Endotracheal tube Findings: She had an essentially normal sized uterus with an 8 cm fibroid off of the anterior lower uterine segment, just above the cervix.  Normal tubes and ovaries, normal abdomen. Specimens: Morcellated uterus for routine pathology Estimated blood loss: 150 cc Complications: None  Procedure in detail: The patient was taken to the operating room and placed in the dorsosupine position. General anesthesia was induced. Arms were tucked to her sides and legs were placed in mobile stirrups. Abdomen perineum and vagina were then prepped and draped in usual sterile fashion and a Foley catheter was inserted. Infraumbilical skin was infiltrated with quarter percent Marcaine and a 1 cm vertical incision was made. A veress needle was inserted into the peritoneal cavity and placement confirmed by the water drop test an opening pressure of 3 mm of mercury. CO2 was insufflated to a pressure of 13 mm mercury and a veress needle was removed. A 5 mm trocar was then introduced with direct visualization with the laparoscope. A 5 mm port was then also placed on the right side under direct visualization. Inspection revealed the above-mentioned findings. A 12 mm port was placed on the left side under direct visualization. The right uterine cornu was grasped with a single-tooth tenaculum from the left side. The Harmonic scalpel Ace was used to take down the right round ligament, fallopian tube, utero-ovarian pedicle and broad ligament. At this point anatomy became difficult due to the large anterior fibroid.  The uterine artery was not taken down on this side yet. A similar procedure was then performed on the patient's left side taking down the round ligament, utero-ovarian pedicle, fallopian tube  and broad ligament. I then used the Harmonic scalpel to start dissecting around this fibroid to remove it from the inferior tissues, careful to avoid the bladder.  The fibroid distorted her anatomy and made it difficult to tell where the cervix was.  I was able to mostly free the fibroid.  At this point I was able to visualize and take down the uterine vessels on the left side with the Harmonic scalpel.  We then turned our attention back to the right side.  At this point I felt we could see enough of the cervix and fibroid to start removing the uterus with the fibroid from the cervix.  First, the uterine vessels were identified and taken down with the Harmonic scalpel with adequate hemostasis.  I then began to remove the uterus from the cervix using a drill, clamp, cut technique on maximum power, using the Harmonic scalpel Ace. This was done about one third of the way from the right side, mostly staying posterior and hugging the inferior portion of the anterior fibroid.  Then, coming from the right side, I was able to free the remainder of the uterus and fibroid from the cervix.  I do not think we entered the vagina and the bladder appeared intact.  The cervical stump appeared to be hemostatic. What appeared to be a remnant of the fibroid was removed from the cervical stump with the Harmonic scalpel.  The 12 mm port was removed and the Storz morcellator was introduced with direct visualization. The uterus was removed via morcellation without difficulty. All visible pieces were removed. The morcellator was removed and the 12 mm trocar was reintroduced. Pelvis was copiously irrigated. Small amount of bleeding from the cervical stump was controlled  with the harmonic scalpel Ace and bipolar cautery.  There was some bleeding on the inferior left part of the cervix, where the fibroid was.  This was eventually controlled with bipolar cautery and a piece of surgicel. A piece of Interceed was placed over the cervical stump.  At this point all pedicles appeared to be hemostatic and there was no other pathology noted. The 12 mm port was removed and I closed this with a 0 Vicryl suture while Dr. Senaida Ores watched from a 5 mm port to make sure did not include bowel in this closure. The remaining 5 mm ports were removed under direct visualization all gas was allowed to deflate from the abdomen. Skin incisions were closed with interrupted subcuticular sutures of 4-0 Vicryl followed by Dermabond. The patient tolerated the procedure well. She was taken to the recovery in stable condition. Counts were correct x2, she received Ancef 2 g IV the beginning of the procedure and had PAS hose on throughout the procedure.

## 2011-11-29 NOTE — Addendum Note (Signed)
Addendum  created 11/29/11 1331 by Lincoln Brigham, CRNA   Modules edited:Notes Section

## 2011-11-30 ENCOUNTER — Encounter (HOSPITAL_COMMUNITY): Payer: Self-pay | Admitting: Obstetrics and Gynecology

## 2011-11-30 DIAGNOSIS — D219 Benign neoplasm of connective and other soft tissue, unspecified: Secondary | ICD-10-CM | POA: Diagnosis present

## 2011-11-30 LAB — CBC
Hemoglobin: 9.8 g/dL — ABNORMAL LOW (ref 12.0–15.0)
MCH: 29.9 pg (ref 26.0–34.0)
MCHC: 33.7 g/dL (ref 30.0–36.0)
MCV: 89.8 fL (ref 78.0–100.0)
Platelets: 142 10*3/uL — ABNORMAL LOW (ref 150–400)
Platelets: 142 10*3/uL — ABNORMAL LOW (ref 150–400)
RBC: 3.28 MIL/uL — ABNORMAL LOW (ref 3.87–5.11)
RBC: 3.62 MIL/uL — ABNORMAL LOW (ref 3.87–5.11)
RDW: 12.4 % (ref 11.5–15.5)
WBC: 7.9 10*3/uL (ref 4.0–10.5)

## 2011-11-30 MED ORDER — OXYCODONE-ACETAMINOPHEN 5-325 MG PO TABS: 1.0000 | ORAL_TABLET | ORAL | Status: AC | PRN

## 2011-11-30 NOTE — Progress Notes (Signed)
POD #1 LSH Doing ok, Toradol helps best with pain, Percocet makes her sleepy, tol diet Afeb, VSS Abd- soft, incisions ok Hgb 10.9 to 9.8 Doing well except Hgb has dropped more than I expect.  Will check Hgb again at 1000, d/c home if stable

## 2011-11-30 NOTE — Discharge Summary (Signed)
Physician Discharge Summary  Patient ID: Karen Flores MRN: [id number] DOB/AGE: October 09, 1966 46 y.o.  Admit date: 11/29/2011 Discharge date: 11/30/2011  Admission Diagnoses:  Symptomatic fibroid uterus  Discharge Diagnoses:  Symptomatic fibroid uterus Active Problems:  Fibroids   Discharged Condition: good  Hospital Course: Underwent [facility], difficult to due large anterior fibroid.  Some bleeding from left side where fibroid was.  Hemoglobin stabilized, no other problems.    Consults: None  Treatments: surgery: [facility]  Discharge Exam: Blood pressure 104/64, pulse 63, temperature 98.1 F (36.7 C), temperature source Oral, resp. rate 20, height 5\' 4"  (1.626 m), weight 77.111 kg (170 lb), SpO2 98.00%.   Disposition:   Discharge Orders    Future Orders Please Complete By Expires   Diet - low sodium heart healthy      Increase activity slowly        Medication List  As of 11/30/2011 12:11 PM   STOP taking these medications         CYCLESSA PO         TAKE these medications         ALPRAZolam 0.25 MG tablet   Commonly known as: XANAX   Take 0.25 mg by mouth daily as needed. For anxiety or nervousness      ibuprofen 200 MG tablet   Commonly known as: ADVIL,MOTRIN   Take 600 mg by mouth daily as needed. For pain      oxyCODONE-acetaminophen 5-325 MG per tablet   Commonly known as: PERCOCET   Take 1-2 tablets by mouth every 3 (three) hours as needed (moderate to severe pain (when tolerating fluids)).           Follow-up Information    Follow up with Alizon Schmeling D, MD. Schedule an appointment as soon as possible for a visit in 3 weeks.   Contact information:   [address] [place] [address] [phone]          Signed: Zenaida Niece 11/30/2011, 12:11 PM

## 2011-12-23 ENCOUNTER — Ambulatory Visit (INDEPENDENT_AMBULATORY_CARE_PROVIDER_SITE_OTHER): Payer: BC Managed Care – PPO | Admitting: Family Medicine

## 2011-12-23 ENCOUNTER — Encounter: Payer: Self-pay | Admitting: Family Medicine

## 2011-12-23 VITALS — BP 110/62 | HR 102 | Temp 98.6°F | Wt 177.0 lb

## 2011-12-23 DIAGNOSIS — L0390 Cellulitis, unspecified: Secondary | ICD-10-CM

## 2011-12-23 DIAGNOSIS — L0291 Cutaneous abscess, unspecified: Secondary | ICD-10-CM

## 2011-12-23 MED ORDER — CEPHALEXIN 500 MG PO CAPS: 500.0000 mg | ORAL_CAPSULE | Freq: Three times a day (TID) | ORAL | Status: AC

## 2011-12-23 NOTE — Progress Notes (Signed)
  Subjective:    Patient ID: Karen Flores, female    DOB: Sep 10, 1966, 46 y.o.   MRN: [id number]  HPI Here for 3 weeks of swelling and soreness on the left 3rd finger.    Review of Systems  Constitutional: Negative.   Skin: Positive for wound.       Objective:   Physical Exam  Constitutional: She appears well-developed and well-nourished.  Skin:       The left 3rd finger has swelling, redness, and tenderness around the nail. The nail itself is misshapen           Assessment & Plan:  Try hot soaks with Epsom

## 2012-01-09 ENCOUNTER — Ambulatory Visit: Payer: BC Managed Care – PPO | Admitting: Family Medicine

## 2012-03-07 ENCOUNTER — Ambulatory Visit (INDEPENDENT_AMBULATORY_CARE_PROVIDER_SITE_OTHER): Payer: BC Managed Care – PPO | Admitting: Family Medicine

## 2012-03-07 ENCOUNTER — Encounter: Payer: Self-pay | Admitting: Family Medicine

## 2012-03-07 VITALS — BP 108/68 | HR 103 | Temp 98.7°F | Wt 175.0 lb

## 2012-03-07 DIAGNOSIS — L609 Nail disorder, unspecified: Secondary | ICD-10-CM

## 2012-03-07 DIAGNOSIS — L608 Other nail disorders: Secondary | ICD-10-CM

## 2012-03-07 NOTE — Progress Notes (Signed)
  Subjective:    Patient ID: Karen Flores, female    DOB: Jul 03, 1966, 46 y.o.   MRN: [id number]  HPI Here to check her left 3rd fingernail again. This has been damaged and will not grow out properly for about 6 months. We treated this for a surrounding cellulitis 2 months ago, and this resolved. The nail remnant gets hard and tender.    Review of Systems  Constitutional: Negative.        Objective:   Physical Exam  Constitutional: She appears well-developed and well-nourished.  Skin:       The left 3rd fingernail bed has a hard remnant over the matrix but the remaining 3/4 of the nail is missing. No infection is seen          Assessment & Plan:  Damaged fingernail. This may need to be permanently removed. We will refer to Dermatology

## 2012-03-31 ENCOUNTER — Encounter (HOSPITAL_COMMUNITY): Payer: Self-pay

## 2012-03-31 ENCOUNTER — Emergency Department (HOSPITAL_COMMUNITY)
Admission: EM | Admit: 2012-03-31 | Discharge: 2012-03-31 | Disposition: A | Discharge status: Home / Self Care | Payer: BC Managed Care – PPO | Attending: Emergency Medicine | Admitting: Emergency Medicine

## 2012-03-31 DIAGNOSIS — M19019 Primary osteoarthritis, unspecified shoulder: Secondary | ICD-10-CM | POA: Insufficient documentation

## 2012-03-31 DIAGNOSIS — M25511 Pain in right shoulder: Secondary | ICD-10-CM

## 2012-03-31 DIAGNOSIS — J40 Bronchitis, not specified as acute or chronic: Secondary | ICD-10-CM | POA: Insufficient documentation

## 2012-03-31 DIAGNOSIS — M4120 Other idiopathic scoliosis, site unspecified: Secondary | ICD-10-CM | POA: Insufficient documentation

## 2012-03-31 DIAGNOSIS — J209 Acute bronchitis, unspecified: Secondary | ICD-10-CM

## 2012-03-31 MED ORDER — AZITHROMYCIN 250 MG PO TABS: 500.0000 mg | ORAL_TABLET | Freq: Every day | ORAL | Status: AC

## 2012-03-31 MED ORDER — KETOROLAC TROMETHAMINE 10 MG PO TABS: 10.0000 mg | ORAL_TABLET | Freq: Four times a day (QID) | ORAL | Status: AC | PRN

## 2012-03-31 MED ORDER — KETOROLAC TROMETHAMINE 60 MG/2ML IM SOLN
60.0000 mg | Freq: Once | INTRAMUSCULAR | Status: AC
  Administered 2012-03-31: 60 mg via INTRAMUSCULAR
  Filled 2012-03-31: qty 2

## 2012-03-31 NOTE — ED Provider Notes (Signed)
History     CSN: [id number]  Arrival date & time 03/31/12  0940   First MD Initiated Contact with Patient 03/31/12 1117      Chief Complaint  Patient presents with  . Arm Pain   46 y/o female  INAD c/o right shoulder pain x2weeks. Pain radiates to wrist with occasional electricity like paraesthesia, Pain is exacerbated by movement and cough. Pt has had a dry cough since onset. Denies fever and SOB. Pain was not preceded by trauma but repetat ive stress from installing screws. Pt has seen her orthopedist and has been given a 12 day course of steroids which she self d/c'd yesterday. Vicodin is helpful, methocarbamol; provides minimal relief. She has an MRI and Ortho follow up scheduled. She presents to today to expedite the MRI. Denies Numbness, decrease in ROM    (Consider location/radiation/quality/duration/timing/severity/associated sxs/prior treatment) Patient is a 46 y.o. female presenting with shoulder pain.  Shoulder Pain This is a new problem. The current episode started 1 to 4 weeks ago. The problem occurs intermittently. Associated symptoms include arthralgias. The symptoms are aggravated by twisting. She has tried oral narcotics and heat for the symptoms. The treatment provided moderate relief.    Past Medical History  Diagnosis Date  . PONV (postoperative nausea and vomiting)   . Anxiety   . Scoliosis     Past Surgical History  Procedure Date  . Hand surgery as a child and in 11/2009     x 2, Left wrist surgery with plates and screws  . Wisdom tooth extraction   . Svd     x 2  . Colonscopy   . Laparoscopic supracervical hysterectomy 11/29/2011    Procedure: LAPAROSCOPIC SUPRACERVICAL HYSTERECTOMY;  Surgeon: Zenaida Niece, MD;  Location: [facility];  Service: Gynecology;  Laterality: N/A;    No family history on file.  History  Substance Use Topics  . Smoking status: Never Smoker   . Smokeless tobacco: Never Used  . Alcohol Use: Yes     once a month    OB  History    Grav Para Term Preterm Abortions TAB SAB Ect Mult Living                  Review of Systems  Constitutional: Negative.   HENT: Negative.   Respiratory: Negative.   Cardiovascular: Negative.   Gastrointestinal: Negative.   Musculoskeletal: Positive for arthralgias.    Allergies  Sulfacetamide sodium  Home Medications   Current Outpatient Rx  Name Route Sig Dispense Refill  . ALPRAZOLAM 0.25 MG PO TABS Oral Take 0.25 mg by mouth daily as needed. For anxiety or nervousness    . DM-GUAIFENESIN ER 30-600 MG PO TB12 Oral Take 1 tablet by mouth every 12 (twelve) hours. Congestion    . HYDROCODONE-ACETAMINOPHEN 5-500 MG PO TABS Oral Take 2 tablets by mouth every 6 (six) hours as needed. Pain    . METHOCARBAMOL 500 MG PO TABS Oral Take 500 mg by mouth 2 (two) times daily.    Marland Kitchen PREDNISONE (PAK) 5 MG PO TABS Oral Take 5 mg by mouth See admin instructions. Follow package instructions      BP 134/90  Pulse 85  Temp(Src) 98 F (36.7 C) (Oral)  Resp 18  SpO2 98%  LMP 11/16/2011  Physical Exam  Constitutional: She appears well-developed and well-nourished. No distress.  HENT:  Head: Normocephalic and atraumatic.  Eyes: Pupils are equal, round, and reactive to light.  Cardiovascular: Normal rate, regular rhythm, normal  heart sounds and intact distal pulses.   Pulmonary/Chest: Effort normal and breath sounds normal. No respiratory distress. She has no wheezes. She has no rales.  Abdominal: Soft. Bowel sounds are normal.  Musculoskeletal: Normal range of motion.       Tender to palpation of right lateral scapula, FROM. No erythema or warmth    ED Course  Procedures (including critical care time)  Labs Reviewed - No data to display No results found.   No diagnosis found. Bronchitis Right shoulder arthralgia   MDM  Right shoulder pain x2 weeks, moderately controlled with PO narcotics. MRI scheduled. Pain level stable with FROM.         Joni Reining  Admiral Marcucci 03/31/12 1149

## 2012-03-31 NOTE — ED Notes (Signed)
Pt in from home with c/o right side shoulder pain radiating down right arm denies recent injury states unable to sleep states took 1 hydrocodone this am states some relief

## 2012-03-31 NOTE — ED Provider Notes (Signed)
Medical screening examination/treatment/procedure(s) were performed by non-physician practitioner and as supervising physician I was immediately available for consultation/collaboration.   Atlas Crossland, MD 03/31/12 1534 

## 2012-03-31 NOTE — Discharge Instructions (Signed)
Start taking Toradol tomorrow, take with food. Alternate hot and cold. Return for worsening of symptoms and follow with your orthopedist

## 2012-04-03 ENCOUNTER — Other Ambulatory Visit: Payer: Self-pay | Admitting: Orthopedic Surgery

## 2012-04-03 DIAGNOSIS — M542 Cervicalgia: Secondary | ICD-10-CM

## 2012-04-04 ENCOUNTER — Ambulatory Visit
Admission: RE | Admit: 2012-04-04 | Discharge: 2012-04-04 | Disposition: A | Discharge status: Home / Self Care | Payer: BC Managed Care – PPO | Source: Ambulatory Visit | Attending: Orthopedic Surgery | Admitting: Orthopedic Surgery

## 2012-04-04 DIAGNOSIS — M542 Cervicalgia: Secondary | ICD-10-CM

## 2012-04-06 ENCOUNTER — Encounter: Payer: Self-pay | Admitting: Family Medicine

## 2012-04-06 ENCOUNTER — Ambulatory Visit (INDEPENDENT_AMBULATORY_CARE_PROVIDER_SITE_OTHER): Payer: BC Managed Care – PPO | Admitting: Family Medicine

## 2012-04-06 VITALS — BP 120/76 | HR 119 | Temp 99.0°F | Wt 172.0 lb

## 2012-04-06 DIAGNOSIS — J40 Bronchitis, not specified as acute or chronic: Secondary | ICD-10-CM

## 2012-04-06 MED ORDER — HYDROCODONE-HOMATROPINE 5-1.5 MG/5ML PO SYRP: 5.0000 mL | ORAL_SOLUTION | ORAL | Status: AC | PRN

## 2012-04-06 MED ORDER — CEPHALEXIN 500 MG PO CAPS: 500.0000 mg | ORAL_CAPSULE | Freq: Three times a day (TID) | ORAL | Status: AC

## 2012-04-06 NOTE — Progress Notes (Signed)
  Subjective:    Patient ID: Karen Flores, female    DOB: [birth year], 46 y.o.   MRN: [id number]  HPI Here for 2 weeks of chest tightness and coughing up green sputum. No fever. She was seen in the ER on 03-31-12 and was given a Zpack, but this did not help.    Review of Systems  Constitutional: Negative.   HENT: Negative.   Eyes: Negative.   Respiratory: Positive for cough and chest tightness. Negative for shortness of breath and wheezing.   Cardiovascular: Negative.        Objective:   Physical Exam  Constitutional: She appears well-developed and well-nourished.  HENT:  Right Ear: External ear normal.  Left Ear: External ear normal.  Nose: Nose normal.  Mouth/Throat: Oropharynx is clear and moist.  Eyes: Conjunctivae are normal.  Pulmonary/Chest: Effort normal and breath sounds normal. No respiratory distress. She has no wheezes. She has no rales.  Lymphadenopathy:    She has no cervical adenopathy.          Assessment & Plan:  Recheck prn

## 2012-04-16 ENCOUNTER — Encounter: Payer: Self-pay | Admitting: Family Medicine

## 2012-04-16 ENCOUNTER — Ambulatory Visit (INDEPENDENT_AMBULATORY_CARE_PROVIDER_SITE_OTHER): Payer: BC Managed Care – PPO | Admitting: Family Medicine

## 2012-04-16 VITALS — BP 114/74 | HR 116 | Temp 98.4°F | Wt 180.0 lb

## 2012-04-16 DIAGNOSIS — J40 Bronchitis, not specified as acute or chronic: Secondary | ICD-10-CM

## 2012-04-16 DIAGNOSIS — R059 Cough, unspecified: Secondary | ICD-10-CM

## 2012-04-16 DIAGNOSIS — R05 Cough: Secondary | ICD-10-CM

## 2012-04-16 MED ORDER — ALBUTEROL SULFATE HFA 108 (90 BASE) MCG/ACT IN AERS: 2.0000 | INHALATION_SPRAY | RESPIRATORY_TRACT | Status: DC | PRN

## 2012-04-16 MED ORDER — METHYLPREDNISOLONE ACETATE 80 MG/ML IJ SUSP
120.0000 mg | Freq: Once | INTRAMUSCULAR | Status: AC
  Administered 2012-04-16: 120 mg via INTRAMUSCULAR

## 2012-04-16 NOTE — Progress Notes (Signed)
  Subjective:    Patient ID: Karen Flores, female    DOB: 06-02-1966, 46 y.o.   MRN: [id number]  HPI Here for an episode of SOB last night. We saw her 10 days ago for a bronchitis which caused her to cough up green sputum. She took a course of Keflex, and in most ways she feels a lot better. The coughing was improving, and her sputum is now clear. However last night she was awakened by a coughing spell that caused her to choke, to vomit once, to feel pressure in her chest, to wheeze, and to feel very SOB. She was quite frightened and did not know what to do. Fortunately this subsided after a few minutes and she got her breath back. She has had no further episodes like this, but she has felt mildly SOB today. The cough today is minimal. No fevers or chest pain.    Review of Systems  Constitutional: Negative.   HENT: Negative.   Eyes: Negative.   Respiratory: Positive for cough, choking, chest tightness, shortness of breath and wheezing.        Objective:   Physical Exam  Constitutional: She appears well-developed and well-nourished.  Neck: Neck supple. No thyromegaly present.  Cardiovascular: Normal rate, regular rhythm, normal heart sounds and intact distal pulses.   Pulmonary/Chest: Effort normal and breath sounds normal. No respiratory distress. She has no wheezes. She has no rales.  Lymphadenopathy:    She has no cervical adenopathy.          Assessment & Plan:  She is having some bronchospasm as a result of her bronchitis. Given a steroid shot, as well as an inhaler to use prn. Get a CXR tomorrow

## 2012-04-16 NOTE — Addendum Note (Signed)
Addended by: Aniceto Boss A on: 04/16/2012 05:33 PM   Modules accepted: Orders

## 2012-04-17 ENCOUNTER — Ambulatory Visit: Payer: BC Managed Care – PPO | Admitting: Family Medicine

## 2012-04-17 ENCOUNTER — Ambulatory Visit (INDEPENDENT_AMBULATORY_CARE_PROVIDER_SITE_OTHER)
Admission: RE | Admit: 2012-04-17 | Discharge: 2012-04-17 | Disposition: A | Discharge status: Home / Self Care | Payer: BC Managed Care – PPO | Source: Ambulatory Visit | Attending: Family Medicine | Admitting: Family Medicine

## 2012-04-17 DIAGNOSIS — R059 Cough, unspecified: Secondary | ICD-10-CM

## 2012-04-17 DIAGNOSIS — R05 Cough: Secondary | ICD-10-CM

## 2012-04-17 NOTE — Progress Notes (Signed)
Quick Note:  I spoke with pt ______ 

## 2012-11-05 ENCOUNTER — Telehealth: Payer: Self-pay | Admitting: Family Medicine

## 2012-11-05 NOTE — Telephone Encounter (Signed)
She should be over the fever by now unless she has a secondary infection. I can see her tomorrow

## 2012-11-05 NOTE — Telephone Encounter (Signed)
Appt made.  Pt aware.

## 2012-11-05 NOTE — Telephone Encounter (Signed)
Patient was seen last Wed at [facility]. Dx w.flu and given Tamiflu. She is not recovered, still has fever. Wants to know if this is normal & should she be seen? If so, can she be seen tomorrow (only SDAs left) or wait til Wed? Please advise, and I will call. Thanks!

## 2012-11-06 ENCOUNTER — Ambulatory Visit (INDEPENDENT_AMBULATORY_CARE_PROVIDER_SITE_OTHER): Payer: BC Managed Care – PPO | Admitting: Family Medicine

## 2012-11-06 ENCOUNTER — Encounter: Payer: Self-pay | Admitting: Family Medicine

## 2012-11-06 VITALS — BP 118/72 | HR 82 | Temp 98.7°F | Wt 184.0 lb

## 2012-11-06 DIAGNOSIS — J329 Chronic sinusitis, unspecified: Secondary | ICD-10-CM

## 2012-11-06 MED ORDER — AZITHROMYCIN 250 MG PO TABS: ORAL_TABLET | ORAL | Status: DC

## 2012-11-06 NOTE — Progress Notes (Signed)
  Subjective:    Patient ID: Karen Flores, female    DOB: Dec 05, 1965, 47 y.o.   MRN: [id number]  HPI Here for fevers which have persisted for over a week. She had the rapid onset of fevers to 103 degrees, body aches, HA, and coughing about a week ago. She and her entire family went to [facility] and were diagnosed with influenza. They were all put on Tamiflu. She has improved a lot but still has a low grade fever, sinus pressure, and a dry cough. On Advil.    Review of Systems  Constitutional: Positive for fever.  HENT: Positive for congestion, postnasal drip and sinus pressure.   Eyes: Negative.   Respiratory: Positive for cough.        Objective:   Physical Exam  Constitutional: She appears well-developed and well-nourished.  HENT:  Right Ear: External ear normal.  Left Ear: External ear normal.  Nose: Nose normal.  Mouth/Throat: Oropharynx is clear and moist.  Eyes: Conjunctivae normal are normal.  Pulmonary/Chest: Effort normal and breath sounds normal.  Lymphadenopathy:    She has no cervical adenopathy.          Assessment & Plan:  Probable secondary sinusitis. Treat with a Zpack

## 2012-12-24 ENCOUNTER — Ambulatory Visit: Payer: BC Managed Care – PPO | Admitting: Family Medicine

## 2012-12-25 ENCOUNTER — Encounter: Payer: Self-pay | Admitting: Family Medicine

## 2012-12-25 ENCOUNTER — Ambulatory Visit (INDEPENDENT_AMBULATORY_CARE_PROVIDER_SITE_OTHER): Payer: BC Managed Care – PPO | Admitting: Family Medicine

## 2012-12-25 VITALS — BP 130/90 | HR 100 | Temp 97.7°F | Wt 182.0 lb

## 2012-12-25 DIAGNOSIS — J019 Acute sinusitis, unspecified: Secondary | ICD-10-CM

## 2012-12-25 MED ORDER — LEVOFLOXACIN 500 MG PO TABS: 500.0000 mg | ORAL_TABLET | Freq: Every day | ORAL | Status: AC

## 2012-12-25 NOTE — Progress Notes (Signed)
  Subjective:    Patient ID: Karen Flores, female    DOB: [birth year], 47 y.o.   MRN: [id number]  HPI Here for one week of sinus congestion, PND, and a dry cough. She took a Zpack in January and she thinks she never really got over the sinus infection then.    Review of Systems  Constitutional: Negative.   HENT: Positive for congestion, postnasal drip and sinus pressure.   Eyes: Negative.   Respiratory: Positive for cough.        Objective:   Physical Exam  Constitutional: She appears well-developed and well-nourished.  HENT:  Right Ear: External ear normal.  Left Ear: External ear normal.  Nose: Nose normal.  Mouth/Throat: Oropharynx is clear and moist.  Eyes: Conjunctivae are normal.  Pulmonary/Chest: Effort normal and breath sounds normal.  Lymphadenopathy:    She has no cervical adenopathy.          Assessment & Plan:  Treat with Levaquin. Recheck prn

## 2013-10-24 HISTORY — PX: CERVICAL DISC SURGERY: SHX588

## 2015-02-02 ENCOUNTER — Ambulatory Visit (INDEPENDENT_AMBULATORY_CARE_PROVIDER_SITE_OTHER): Payer: 59 | Admitting: Family Medicine

## 2015-02-02 ENCOUNTER — Encounter: Payer: Self-pay | Admitting: Family Medicine

## 2015-02-02 VITALS — BP 126/100 | HR 93 | Temp 98.8°F | Ht 64.5 in | Wt 193.0 lb

## 2015-02-02 DIAGNOSIS — K219 Gastro-esophageal reflux disease without esophagitis: Secondary | ICD-10-CM

## 2015-02-02 DIAGNOSIS — M542 Cervicalgia: Secondary | ICD-10-CM

## 2015-02-02 DIAGNOSIS — IMO0001 Reserved for inherently not codable concepts without codable children: Secondary | ICD-10-CM

## 2015-02-02 DIAGNOSIS — R030 Elevated blood-pressure reading, without diagnosis of hypertension: Secondary | ICD-10-CM | POA: Diagnosis not present

## 2015-02-02 DIAGNOSIS — G8929 Other chronic pain: Secondary | ICD-10-CM | POA: Diagnosis not present

## 2015-02-02 MED ORDER — OMEPRAZOLE 40 MG PO CPDR: 40.0000 mg | DELAYED_RELEASE_CAPSULE | Freq: Every day | ORAL | Status: DC

## 2015-02-02 MED ORDER — TRAMADOL HCL 50 MG PO TABS: 50.0000 mg | ORAL_TABLET | Freq: Four times a day (QID) | ORAL | Status: DC | PRN

## 2015-02-02 NOTE — Progress Notes (Signed)
Pre visit review using our clinic review tool, if applicable. No additional management support is needed unless otherwise documented below in the visit note. 

## 2015-02-02 NOTE — Progress Notes (Signed)
   Subjective:    Patient ID: Karen Flores, female    DOB: 07/04/1966, 49 y.o.   MRN: [id number]  HPI Here for several concerns. First she has had a lot of heartburn on the past few months and she has treated this with Zantac and TUMS with partial relief. No trouble swallowing. Also she has noticed that her BP has been creeping up over the past year, often in the range of 135-145 over 90-100. Lastly she asks if we could take over writing for her tramadol which she uses intermittently for neck pain. She had cervical spine surgery last year and this helped a lot but she still has some residual pain. She was released from the care of her surgeon.    Review of Systems  Constitutional: Negative.   Respiratory: Negative.   Cardiovascular: Negative.   Gastrointestinal: Negative.   Musculoskeletal: Positive for neck pain and neck stiffness.       Objective:   Physical Exam  Constitutional: She appears well-developed and well-nourished.  Cardiovascular: Normal rate, regular rhythm, normal heart sounds and intact distal pulses.   Pulmonary/Chest: Effort normal and breath sounds normal.  Abdominal: Soft. Bowel sounds are normal. She exhibits no distension. There is no tenderness. There is no rebound and no guarding.  Musculoskeletal: Normal range of motion. She exhibits no edema or tenderness.          Assessment & Plan:  Try Omeprazole 40 mg daily for the GERD. She will monitor the BP closely at home. We discussed getting exercise, limiting her sodium intake, and above all losing weight to get the BP down. We will recheck this in 3 months. Given Tramadol to use prn.

## 2015-04-20 ENCOUNTER — Other Ambulatory Visit: Payer: Self-pay | Admitting: Family Medicine

## 2015-04-20 NOTE — Telephone Encounter (Signed)
Call in #60 with 5 rf 

## 2015-06-02 ENCOUNTER — Other Ambulatory Visit: Payer: Self-pay | Admitting: Radiology

## 2015-10-01 ENCOUNTER — Other Ambulatory Visit: Payer: Self-pay | Admitting: Family Medicine

## 2015-10-02 NOTE — Telephone Encounter (Signed)
Call in #60 wit 5 rf 

## 2015-11-26 ENCOUNTER — Other Ambulatory Visit: Payer: Self-pay | Admitting: Family Medicine

## 2016-02-22 ENCOUNTER — Other Ambulatory Visit: Payer: Self-pay | Admitting: Family Medicine

## 2016-03-14 ENCOUNTER — Other Ambulatory Visit: Payer: Self-pay | Admitting: Family Medicine

## 2016-03-14 NOTE — Telephone Encounter (Signed)
Call in #60 only. She needs an OV soon  

## 2016-04-07 ENCOUNTER — Other Ambulatory Visit: Payer: Self-pay | Admitting: Family Medicine

## 2016-04-07 NOTE — Telephone Encounter (Signed)
Ok to refill 

## 2016-04-08 NOTE — Telephone Encounter (Signed)
NO refills without an OV  

## 2016-06-09 ENCOUNTER — Other Ambulatory Visit: Payer: Self-pay | Admitting: Family Medicine

## 2016-09-13 ENCOUNTER — Other Ambulatory Visit: Payer: Self-pay | Admitting: Family Medicine

## 2016-09-26 ENCOUNTER — Encounter: Payer: Self-pay | Admitting: Family Medicine

## 2016-09-26 ENCOUNTER — Ambulatory Visit (INDEPENDENT_AMBULATORY_CARE_PROVIDER_SITE_OTHER): Payer: Managed Care, Other (non HMO) | Admitting: Family Medicine

## 2016-09-26 VITALS — BP 137/106 | HR 96 | Temp 98.1°F | Ht 64.5 in | Wt 195.0 lb

## 2016-09-26 DIAGNOSIS — L723 Sebaceous cyst: Secondary | ICD-10-CM | POA: Diagnosis not present

## 2016-09-26 MED ORDER — ALPRAZOLAM 0.25 MG PO TABS: 0.2500 mg | ORAL_TABLET | Freq: Three times a day (TID) | ORAL | 5 refills | Status: DC | PRN

## 2016-09-26 MED ORDER — OMEPRAZOLE 40 MG PO CPDR: 40.0000 mg | DELAYED_RELEASE_CAPSULE | Freq: Every day | ORAL | 3 refills | Status: DC

## 2016-09-26 NOTE — Progress Notes (Signed)
Pre visit review using our clinic review tool, if applicable. No additional management support is needed unless otherwise documented below in the visit note. 

## 2016-09-26 NOTE — Progress Notes (Signed)
   Subjective:    Patient ID: Karen Flores, female    DOB: 16-Aug-1966, 50 y.o.   MRN: [id number]  HPI Here for a tender lump that appeared on the left inner thigh 3 days ago. It seems to be a little smaller today than it was yesterday. She feels fine in general.    Review of Systems  Constitutional: Negative.   Respiratory: Negative.   Cardiovascular: Negative.   Skin: Positive for wound.       Objective:   Physical Exam  Constitutional: She appears well-developed and well-nourished.  Cardiovascular: Normal rate, regular rhythm, normal heart sounds and intact distal pulses.   Pulmonary/Chest: Effort normal and breath sounds normal.  Skin:  The upper inner left thigh has a small tender firm lump just under the skin          Assessment & Plan:  This is a sebaceous cyst, and it seems to be resolving. She will use warm compresses and monitor. Recheck prn.  Laurey Morale, MD

## 2017-02-01 ENCOUNTER — Encounter: Payer: Self-pay | Admitting: Family Medicine

## 2017-02-01 ENCOUNTER — Ambulatory Visit (INDEPENDENT_AMBULATORY_CARE_PROVIDER_SITE_OTHER): Payer: Managed Care, Other (non HMO) | Admitting: Family Medicine

## 2017-02-01 VITALS — BP 140/98 | HR 84 | Temp 98.3°F | Ht 64.5 in | Wt 200.0 lb

## 2017-02-01 DIAGNOSIS — Z0000 Encounter for general adult medical examination without abnormal findings: Secondary | ICD-10-CM

## 2017-02-01 DIAGNOSIS — K219 Gastro-esophageal reflux disease without esophagitis: Secondary | ICD-10-CM | POA: Diagnosis not present

## 2017-02-01 LAB — CBC WITH DIFFERENTIAL/PLATELET
BASOS PCT: 0.4 % (ref 0.0–3.0)
Basophils Absolute: 0 10*3/uL (ref 0.0–0.1)
EOS ABS: 0.1 10*3/uL (ref 0.0–0.7)
EOS PCT: 2.3 % (ref 0.0–5.0)
HCT: 37.8 % (ref 36.0–46.0)
Hemoglobin: 12.4 g/dL (ref 12.0–15.0)
LYMPHS ABS: 1.5 10*3/uL (ref 0.7–4.0)
Lymphocytes Relative: 27.1 % (ref 12.0–46.0)
MCHC: 32.9 g/dL (ref 30.0–36.0)
MCV: 86.1 fl (ref 78.0–100.0)
MONO ABS: 0.3 10*3/uL (ref 0.1–1.0)
Monocytes Relative: 6.2 % (ref 3.0–12.0)
NEUTROS PCT: 64 % (ref 43.0–77.0)
Neutro Abs: 3.5 10*3/uL (ref 1.4–7.7)
Platelets: 233 10*3/uL (ref 150.0–400.0)
RBC: 4.39 Mil/uL (ref 3.87–5.11)
RDW: 13.6 % (ref 11.5–15.5)
WBC: 5.5 10*3/uL (ref 4.0–10.5)

## 2017-02-01 LAB — POC URINALSYSI DIPSTICK (AUTOMATED)
Bilirubin, UA: NEGATIVE
Glucose, UA: NEGATIVE
KETONES UA: NEGATIVE
Leukocytes, UA: NEGATIVE — AB
Nitrite, UA: NEGATIVE
PROTEIN UA: NEGATIVE
RBC UA: NEGATIVE
Spec Grav, UA: 1.015 (ref 1.010–1.025)
UROBILINOGEN UA: 0.2 U/dL
pH, UA: 6 (ref 5.0–8.0)

## 2017-02-01 LAB — LIPID PANEL
CHOLESTEROL: 234 mg/dL — AB (ref 0–200)
HDL: 40.1 mg/dL (ref >39.00)
LDL Cholesterol: 156 mg/dL — ABNORMAL HIGH (ref 0–99)
NonHDL: 193.92
TRIGLYCERIDES: 191 mg/dL — AB (ref 0.0–149.0)
Total CHOL/HDL Ratio: 6
VLDL: 38.2 mg/dL (ref 0.0–40.0)

## 2017-02-01 LAB — HEPATIC FUNCTION PANEL
ALBUMIN: 4.1 g/dL (ref 3.5–5.2)
ALT: 29 U/L (ref 0–35)
AST: 24 U/L (ref 0–37)
Alkaline Phosphatase: 89 U/L (ref 39–117)
Bilirubin, Direct: 0.1 mg/dL (ref 0.0–0.3)
TOTAL PROTEIN: 6.8 g/dL (ref 6.0–8.3)
Total Bilirubin: 0.5 mg/dL (ref 0.2–1.2)

## 2017-02-01 LAB — BASIC METABOLIC PANEL
BUN: 12 mg/dL (ref 6–23)
CALCIUM: 9.2 mg/dL (ref 8.4–10.5)
CHLORIDE: 106 meq/L (ref 96–112)
CO2: 27 mEq/L (ref 19–32)
CREATININE: 0.98 mg/dL (ref 0.40–1.20)
GFR: 63.91 mL/min (ref >60.00)
GLUCOSE: 99 mg/dL (ref 70–99)
Potassium: 4.4 mEq/L (ref 3.5–5.1)
Sodium: 140 mEq/L (ref 135–145)

## 2017-02-01 LAB — TSH: TSH: 1.57 u[IU]/mL (ref 0.35–4.50)

## 2017-02-01 NOTE — Progress Notes (Signed)
   Subjective:    Patient ID: Karen Flores, female    DOB: [birth year], 51 y.o.   MRN: [id number]  HPI 51 yr old female for a well exam and to discuss GERD. She has had heartburn and reflux symptoms for years, and she has had good results with Prilosec. However over the past few months she has had more reflux symptoms despite this. No nausea or trouble swallowing. She is also very concerned that something else may be wrong with her because a close friend was recently diagnosed with lung cancer (they had been treated for GERD which had been misdiagnosed). In general she eats well and exercises. She checks her BP occasionally at the pharmacy and it has been borderline around 140/90.    Review of Systems  Constitutional: Negative.   HENT: Negative.   Eyes: Negative.   Respiratory: Negative.   Cardiovascular: Negative.   Gastrointestinal: Positive for abdominal pain. Negative for abdominal distention, anal bleeding, blood in stool, constipation, diarrhea, nausea, rectal pain and vomiting.  Genitourinary: Negative for decreased urine volume, difficulty urinating, dyspareunia, dysuria, enuresis, flank pain, frequency, hematuria, pelvic pain and urgency.  Musculoskeletal: Negative.   Skin: Negative.   Neurological: Negative.   Psychiatric/Behavioral: Negative.        Objective:   Physical Exam  Constitutional: She is oriented to person, place, and time. She appears well-developed and well-nourished. No distress.  HENT:  Head: Normocephalic and atraumatic.  Right Ear: External ear normal.  Left Ear: External ear normal.  Nose: Nose normal.  Mouth/Throat: Oropharynx is clear and moist. No oropharyngeal exudate.  Eyes: Conjunctivae and EOM are normal. Pupils are equal, round, and reactive to light. No scleral icterus.  Neck: Normal range of motion. Neck supple. No JVD present. No thyromegaly present.  Cardiovascular: Normal rate, regular rhythm, normal heart sounds and intact distal pulses.   Exam reveals no gallop and no friction rub.   No murmur heard. Pulmonary/Chest: Effort normal and breath sounds normal. No respiratory distress. She has no wheezes. She has no rales. She exhibits no tenderness.  Abdominal: Soft. Bowel sounds are normal. She exhibits no distension and no mass. There is no tenderness. There is no rebound and no guarding.  Musculoskeletal: Normal range of motion. She exhibits no edema or tenderness.  Lymphadenopathy:    She has no cervical adenopathy.  Neurological: She is alert and oriented to person, place, and time. She has normal reflexes. No cranial nerve deficit. She exhibits normal muscle tone. Coordination normal.  Skin: Skin is warm and dry. No rash noted. No erythema.  Psychiatric: She has a normal mood and affect. Her behavior is normal. Judgment and thought content normal.          Assessment & Plan:  Well exam. We discussed diet and exercise. She will watch the BP closely and if it gets any higher we will start her on medication for this. It sounds like her GERD has worsened so I suggested she take Zantac 150 mg every morning along with her evening dose of Prilosec. We will refer her to GI to evaluate further.  Alysia Penna, MD

## 2017-02-01 NOTE — Progress Notes (Signed)
Pre visit review using our clinic review tool, if applicable. No additional management support is needed unless otherwise documented below in the visit note. 

## 2017-02-01 NOTE — Patient Instructions (Signed)
WE NOW OFFER   [facility] FAST TRACK!!!  SAME DAY Appointments for ACUTE CARE  Such as: Sprains, Injuries, cuts, abrasions, rashes, muscle pain, joint pain, back pain Colds, flu, sore throats, headache, allergies, cough, fever  Ear pain, sinus and eye infections Abdominal pain, nausea, vomiting, diarrhea, upset stomach Animal/insect bites  3 Easy Ways to Schedule: Walk-In Scheduling Call in scheduling Mychart Sign-up: [email or web address]         

## 2017-04-07 ENCOUNTER — Other Ambulatory Visit: Payer: Self-pay | Admitting: Family Medicine

## 2017-04-07 NOTE — Telephone Encounter (Signed)
Call in #90 with 5 rf 

## 2017-04-27 ENCOUNTER — Encounter: Payer: Self-pay | Admitting: Family Medicine

## 2017-07-17 ENCOUNTER — Telehealth: Payer: Self-pay | Admitting: Family Medicine

## 2017-07-17 MED ORDER — HYDROCORTISONE 2.5 % RE CREA: 1.0000 "application " | TOPICAL_CREAM | Freq: Two times a day (BID) | RECTAL | 5 refills | Status: DC | PRN

## 2017-07-17 NOTE — Telephone Encounter (Signed)
I sent script e-scribe to [facility]. 

## 2017-07-17 NOTE — Telephone Encounter (Signed)
Call in Anusol 2.5% rectal cream, apply bid prn, 30 grams with 5 rf

## 2017-07-17 NOTE — Telephone Encounter (Signed)
Pt would like to see if Dr. Sarajane Jews could call in Rx Anusol for her hemorrhoids state that she is too busy to come in for an appointment and she has had this medication before from her general surgeon and they are not able to give it to her they told her to call her PCP to get.  Pharm:  [facility] [place] on [address]

## 2017-09-16 ENCOUNTER — Encounter (HOSPITAL_COMMUNITY): Payer: Self-pay

## 2017-09-16 ENCOUNTER — Other Ambulatory Visit: Payer: Self-pay

## 2017-09-16 ENCOUNTER — Emergency Department (HOSPITAL_COMMUNITY)
Admission: EM | Admit: 2017-09-16 | Discharge: 2017-09-16 | Disposition: A | Discharge status: Home / Self Care | Payer: No Typology Code available for payment source | Attending: Emergency Medicine | Admitting: Emergency Medicine

## 2017-09-16 ENCOUNTER — Emergency Department (HOSPITAL_COMMUNITY): Payer: No Typology Code available for payment source

## 2017-09-16 DIAGNOSIS — J302 Other seasonal allergic rhinitis: Secondary | ICD-10-CM | POA: Diagnosis not present

## 2017-09-16 DIAGNOSIS — Z79899 Other long term (current) drug therapy: Secondary | ICD-10-CM | POA: Diagnosis not present

## 2017-09-16 DIAGNOSIS — R0789 Other chest pain: Secondary | ICD-10-CM

## 2017-09-16 DIAGNOSIS — R079 Chest pain, unspecified: Secondary | ICD-10-CM | POA: Diagnosis present

## 2017-09-16 DIAGNOSIS — F419 Anxiety disorder, unspecified: Secondary | ICD-10-CM | POA: Diagnosis not present

## 2017-09-16 DIAGNOSIS — J3089 Other allergic rhinitis: Secondary | ICD-10-CM

## 2017-09-16 LAB — CBC
HCT: 39.8 % (ref 36.0–46.0)
Hemoglobin: 13.1 g/dL (ref 12.0–15.0)
MCH: 28.2 pg (ref 26.0–34.0)
MCHC: 32.9 g/dL (ref 30.0–36.0)
MCV: 85.8 fL (ref 78.0–100.0)
PLATELETS: 221 10*3/uL (ref 150–400)
RBC: 4.64 MIL/uL (ref 3.87–5.11)
RDW: 13.5 % (ref 11.5–15.5)
WBC: 6.2 10*3/uL (ref 4.0–10.5)

## 2017-09-16 LAB — I-STAT TROPONIN, ED
TROPONIN I, POC: 0 ng/mL (ref 0.00–0.08)
Troponin i, poc: 0.04 ng/mL (ref 0.00–0.08)

## 2017-09-16 LAB — BASIC METABOLIC PANEL
Anion gap: 6 (ref 5–15)
BUN: 14 mg/dL (ref 6–20)
CHLORIDE: 107 mmol/L (ref 101–111)
CO2: 26 mmol/L (ref 22–32)
CREATININE: 1.14 mg/dL — AB (ref 0.44–1.00)
Calcium: 9.2 mg/dL (ref 8.9–10.3)
GFR calc Af Amer: >60 mL/min (ref >60)
GFR calc non Af Amer: 55 mL/min — ABNORMAL LOW (ref >60)
GLUCOSE: 106 mg/dL — AB (ref 65–99)
Potassium: 4.1 mmol/L (ref 3.5–5.1)
SODIUM: 139 mmol/L (ref 135–145)

## 2017-09-16 MED ORDER — ACETAMINOPHEN 500 MG PO TABS
1000.0000 mg | ORAL_TABLET | Freq: Once | ORAL | Status: AC
  Administered 2017-09-16: 1000 mg via ORAL
  Filled 2017-09-16: qty 2

## 2017-09-16 MED ORDER — DIAZEPAM 2 MG PO TABS
2.0000 mg | ORAL_TABLET | Freq: Once | ORAL | Status: AC
  Administered 2017-09-16: 2 mg via ORAL
  Filled 2017-09-16: qty 1

## 2017-09-16 MED ORDER — SODIUM CHLORIDE 0.9 % IV BOLUS (SEPSIS)
1000.0000 mL | Freq: Once | INTRAVENOUS | Status: AC
  Administered 2017-09-16: 1000 mL via INTRAVENOUS

## 2017-09-16 NOTE — ED Notes (Signed)
Pt family called this RN to room. Pt sts that she had "a funny feeling" When questioned pt, she states she felt clammy and had tightening in her face. Pt HR was elevated but quickly returned to normal. Notified Dr Shellee Milo

## 2017-09-16 NOTE — ED Triage Notes (Signed)
Pt is c/o centralized chest pain described as pressure, with a tinglingl prickling feeling radiating to bilateral arms.Pt does report having a HA, dizziness diaphoresis. Denies N/V and SOB  Pt reports having similar episode Wed.  Pt has hx of Acid Reflux, EKG completed in Triage

## 2017-09-16 NOTE — ED Provider Notes (Signed)
[facility] DEPT Provider Note  CSN: [id number] Arrival date & time: 09/16/17 1110  Chief Complaint(s) Chest Pain  HPI Karen Flores is a 51 y.o. female   The history is provided by the patient.  Chest Pain   This is a new problem. Episode onset: 3 days. Episode frequency: intermittent. Progression since onset: fluctuating. The pain is present in the substernal region. The pain is moderate. The quality of the pain is described as pressure-like, vice-like and heavy. The pain radiates to the left arm. Associated symptoms include nausea. Pertinent negatives include no abdominal pain, no cough, no diaphoresis, no fever, no leg pain, no lower extremity edema, no palpitations, no shortness of breath and no vomiting. Associated symptoms comments: With tunnel vision, clamminess, flushing. She has tried nothing for the symptoms.  Pertinent negatives for past medical history include no CAD, no diabetes, no hyperlipidemia, no hypertension, no MI, no PE and no strokes.  Pertinent negatives for family medical history include: no early MI.  Procedure history is negative for exercise treadmill test.    Past Medical History Past Medical History:  Diagnosis Date  . Anxiety   . PONV (postoperative nausea and vomiting)   . Scoliosis    Patient Active Problem List   Diagnosis Date Noted  . Esophageal reflux 02/02/2015  . Elevated BP 02/02/2015  . Chronic neck pain 02/02/2015  . Fibroids 11/30/2011  . INSOMNIA 07/06/2010  . CONCUSSION 07/06/2010  . CONTUSION, HEAD 07/06/2010  . PHARYNGITIS 11/14/2008  . DEPRESSION 05/02/2008  . IBS 05/02/2008  . PARONYCHIA 05/02/2008  . LATERAL EPICONDYLITIS 05/02/2008   Home Medication(s) Prior to Admission medications   Medication Sig Start Date End Date Taking? Authorizing Provider  acetaminophen (TYLENOL) 500 MG tablet Take 500 mg by mouth every 6 (six) hours as needed for mild pain.   Yes [provider]    ALPRAZolam (XANAX) 0.25 MG tablet TAKE ONE TABLET BY MOUTH THREE TIMES DAILY AS NEEDED FOR ANXIETY OR NERVOUSNESS 04/07/17  Yes Laurey Morale, MD  DM-Phenylephrine-Acetaminophen (VICKS DAYQUIL COLD & FLU) 10-5-325 MG CAPS Take 2 capsules by mouth 2 (two) times daily as needed (congestion).   Yes [provider]  omeprazole (PRILOSEC) 40 MG capsule Take 40 mg by mouth daily.    Yes Medoff, Dellis Filbert, MD  hydrocortisone (ANUSOL-HC) 2.5 % rectal cream Place 1 application rectally 2 (two) times daily as needed for hemorrhoids or anal itching. Patient not taking: Reported on 09/16/2017 07/17/17   Laurey Morale, MD                                                                                                                                    Past Surgical History Past Surgical History:  Procedure Laterality Date  . CERVICAL DISC SURGERY  2015  . colonscopy    . HAND SURGERY  as a child and in 11/2009    x 2,  Left wrist surgery with plates and screws  . LAPAROSCOPIC SUPRACERVICAL HYSTERECTOMY  11/29/2011   Procedure: LAPAROSCOPIC SUPRACERVICAL HYSTERECTOMY;  Surgeon: Clarene Duke, MD;  Location: [facility];  Service: Gynecology;  Laterality: N/A;  . SPINE SURGERY    . SVD     x 2  . WISDOM TOOTH EXTRACTION     Family History History reviewed. No pertinent family history.  Social History Social History   Tobacco Use  . Smoking status: Never Smoker  . Smokeless tobacco: Never Used  Substance Use Topics  . Alcohol use: Yes    Alcohol/week: 0.0 oz    Comment: few times per month  . Drug use: No   Allergies Sulfacetamide sodium  Review of Systems Review of Systems  Constitutional: Negative for diaphoresis and fever.  Respiratory: Negative for cough and shortness of breath.   Cardiovascular: Positive for chest pain. Negative for palpitations.  Gastrointestinal: Positive for nausea. Negative for abdominal pain and vomiting.   All other systems are reviewed and are negative for  acute change except as noted in the HPI  Physical Exam Vital Signs  I have reviewed the triage vital signs BP (!) 162/98   Pulse 82   Temp (!) 97.5 F (36.4 C) (Oral)   Resp 15   Ht 5\' 5"  (1.651 m)   Wt 72.6 kg (160 lb)   LMP 11/16/2011   SpO2 96%   BMI 26.63 kg/m   Physical Exam  Constitutional: She is oriented to person, place, and time. She appears well-developed and well-nourished. No distress.  HENT:  Head: Normocephalic and atraumatic.  Nose: Nose normal.  Eyes: Conjunctivae and EOM are normal. Pupils are equal, round, and reactive to light. Right eye exhibits no discharge. Left eye exhibits no discharge. No scleral icterus.  Neck: Normal range of motion. Neck supple.  Cardiovascular: Normal rate and regular rhythm. Exam reveals no gallop and no friction rub.  No murmur heard. Pulmonary/Chest: Effort normal and breath sounds normal. No stridor. No respiratory distress. She has no rales.  Abdominal: Soft. She exhibits no distension. There is no tenderness.  Musculoskeletal: She exhibits no edema or tenderness.  Neurological: She is alert and oriented to person, place, and time.  Skin: Skin is warm and dry. No rash noted. She is not diaphoretic. No erythema.  Psychiatric: She has a normal mood and affect.  Vitals reviewed.   ED Results and Treatments Labs (all labs ordered are listed, but only abnormal results are displayed) Labs Reviewed  BASIC METABOLIC PANEL - Abnormal; Notable for the following components:      Result Value   Glucose, Bld 106 (*)    Creatinine, Ser 1.14 (*)    GFR calc non Af Amer 55 (*)    All other components within normal limits  CBC  I-STAT TROPONIN, ED  I-STAT TROPONIN, ED  EKG  EKG Interpretation  Date/Time:  Saturday September 16 2017 11:13:58 EST Ventricular Rate:  94 PR Interval:    QRS Duration: 88 QT  Interval:  356 QTC Calculation: 446 R Axis:   8 Text Interpretation:  Sinus rhythm Baseline wander in lead(s) V3 NO STEMI No old tracing to compare Confirmed by Addison Lank [phone]) on 09/16/2017 11:47:47 AM      Radiology Dg Chest 2 View  Result Date: 09/16/2017 CLINICAL DATA:  Chest pain. EXAM: CHEST  2 VIEW COMPARISON:  Radiographs of April 17, 2012. FINDINGS: The heart size and mediastinal contours are within normal limits. Both lungs are clear. No pneumothorax or pleural effusion is noted. Small hiatal hernia is noted. The visualized skeletal structures are unremarkable. IMPRESSION: No active cardiopulmonary disease.  Small hiatal hernia. Electronically Signed   By: Marijo Conception, M.D.   On: 09/16/2017 11:45   Pertinent labs & imaging results that were available during my care of the patient were reviewed by me and considered in my medical decision making (see chart for details).  Medications Ordered in ED Medications  acetaminophen (TYLENOL) tablet 1,000 mg (1,000 mg Oral Given 09/16/17 1234)  sodium chloride 0.9 % bolus 1,000 mL (0 mLs Intravenous Stopped 09/16/17 1328)  diazepam (VALIUM) tablet 2 mg (2 mg Oral Given 09/16/17 1345)                                                                                                                                    Procedures Procedures  (including critical care time)  Medical Decision Making / ED Course I have reviewed the nursing notes for this encounter and the patient's prior records (if available in EHR or on provided paperwork).    Atypical chest pain.  EKG without acute ischemic changes or evidence of pericarditis.  Initial troponin negative.  Heart score less than 4.  Appropriate for delta troponin if rest of the workup is unremarkable.  Low pretest probability for pulmonary embolism.  Presentation not classic for aortic dissection or esophageal perforation.  Chest x-ray without evidence suggestive of pneumonia,  pneumothorax, pneumomediastinum.  No abnormal contour of the mediastinum to suggest dissection. No evidence of acute injuries.  Labs otherwise grossly reassuring without evidence of electrolyte derangements.  Delta troponin negative.  For her symptoms are multifactorial and likely secondary to decongestant use with a component of anxiety.  The patient appears reasonably screened and/or stabilized for discharge and I doubt any other medical condition or other [facility] requiring further screening, evaluation, or treatment in the ED at this time prior to discharge.  The patient is safe for discharge with strict return precautions.   Final Clinical Impression(s) / ED Diagnoses Final diagnoses:  Atypical chest pain  Anxiety  Seasonal allergic rhinitis due to other allergic trigger    Disposition: Discharge  Condition: Good  I have discussed the results, Dx and Tx plan with the patient who expressed  understanding and agree(s) with the plan. Discharge instructions discussed at great length. The patient was given strict return precautions who verbalized understanding of the instructions. No further questions at time of discharge.    ED Discharge Orders    None       Follow Up: Laurey Morale, [place] [address] [phone]  Go to  As scheduled in 2 days    This chart was dictated using voice recognition software.  Despite best efforts to proofread,  errors can occur which can change the documentation meaning.   Fatima Blank, MD 09/16/17 1525

## 2017-09-18 ENCOUNTER — Ambulatory Visit (INDEPENDENT_AMBULATORY_CARE_PROVIDER_SITE_OTHER): Payer: 59 | Admitting: Family Medicine

## 2017-09-18 ENCOUNTER — Encounter: Payer: Self-pay | Admitting: Family Medicine

## 2017-09-18 VITALS — BP 158/90 | HR 90 | Temp 97.7°F | Wt 196.8 lb

## 2017-09-18 DIAGNOSIS — J018 Other acute sinusitis: Secondary | ICD-10-CM

## 2017-09-18 DIAGNOSIS — F411 Generalized anxiety disorder: Secondary | ICD-10-CM | POA: Diagnosis not present

## 2017-09-18 DIAGNOSIS — I10 Essential (primary) hypertension: Secondary | ICD-10-CM

## 2017-09-18 MED ORDER — AMOXICILLIN-POT CLAVULANATE 875-125 MG PO TABS: 1.0000 | ORAL_TABLET | Freq: Two times a day (BID) | ORAL | 0 refills | Status: DC

## 2017-09-18 MED ORDER — LISINOPRIL-HYDROCHLOROTHIAZIDE 10-12.5 MG PO TABS: 1.0000 | ORAL_TABLET | Freq: Every day | ORAL | 2 refills | Status: DC

## 2017-09-18 MED ORDER — DIAZEPAM 5 MG PO TABS: 5.0000 mg | ORAL_TABLET | Freq: Two times a day (BID) | ORAL | 2 refills | Status: DC | PRN

## 2017-09-18 NOTE — Progress Notes (Signed)
   Subjective:    Patient ID: Karen Flores, female    DOB: 1966/01/15, 51 y.o.   MRN: [id number] Here to follow up an ER visit on 09-16-17 for chest pains. These had been going on intermittently for 3 days. They radiates down the left arm. No SOB. Her exam and labs were normal. Her EKG and CXR were normal. They diagnosed "atypical chest pain" and had her follow up with [place]. She has been dealing with sinus pressure and some headaches for 2 months. No cough or fever or ST. She has been taking a lot of OTC sinus medications like Nyquil and Sudafed with little effect. Her BP has been a bit high for several months now, and it was quite high at the ER. Also she has been using Xanax for anxiety but it makes her sleepy.   Review of Systems  Constitutional: Negative.   HENT: Positive for congestion, sinus pressure and sinus pain. Negative for ear pain, postnasal drip and sore throat.   Eyes: Negative.   Respiratory: Negative.   Cardiovascular: Positive for chest pain. Negative for palpitations and leg swelling.  Neurological: Positive for headaches.  Psychiatric/Behavioral: The patient is nervous/anxious.        Objective:   Physical Exam  Constitutional: She is oriented to person, place, and time. She appears well-developed and well-nourished. No distress.  HENT:  Right Ear: External ear normal.  Left Ear: External ear normal.  Nose: Nose normal.  Mouth/Throat: Oropharynx is clear and moist.  Eyes: Conjunctivae and EOM are normal. Pupils are equal, round, and reactive to light.  Neck: No thyromegaly present.  Cardiovascular: Normal rate, regular rhythm, normal heart sounds and intact distal pulses.  Pulmonary/Chest: Effort normal and breath sounds normal. No respiratory distress. She has no wheezes. She has no rales.  Lymphadenopathy:    She has no cervical adenopathy.  Neurological: She is alert and oriented to person, place, and time.  Psychiatric:  Anxious           Assessment &  Plan:  First she has had a sinusitis so we will treat this with Augmentin. She will use only Mucinex for congestion, because other OTC agents can affect her BP. She now has HTN and she will start on Lisinopril HCT daily. Try Valium for anxiety. We will recheck all these issues in 2 weeks.  Alysia Penna, MD

## 2017-09-25 ENCOUNTER — Ambulatory Visit: Payer: Self-pay | Admitting: *Deleted

## 2017-09-25 NOTE — Telephone Encounter (Signed)
Spoke w/ patient and triage RN and scheduled appt w/ Dr. Sarajane Jews for tomorrow morning. Patient having dizzy spells since starting lisinopril. She did not take dose today and will continue to hold med until appt tomorrow morning. See full triage note from Sheran Spine, RN.

## 2017-09-25 NOTE — Telephone Encounter (Signed)
Pt called with complaints of dizziness and weakness; pt was evaluated by Dr  Sarajane Jews on 09/18/17 and started lisinopril and antibiotics; previously went to ED on 09/16/17 with chest pain; Nurse triage initiated per protocol; Pt states that she prefers to be seen by Dr Sarajane Jews but no appointments available with him today; initiated conference call with Hoyle Sauer at [facility]; pt states that she has not been able to take blood pessure at home; she instructed pt not to take any of her BP medication until evaluation by MD; Pt offered and accepted appointment on 09/26/17 at 1230 with Dr Sarajane Jews which was set up by Hoyle Sauer; pt verbalizes understanding.   Reason for Disposition . Taking a medicine that could cause dizziness (e.g., blood pressure medications, diuretics)  Answer Assessment - Initial Assessment Questions 1. DESCRIPTION: "Describe your dizziness."     Light headed not spinning 2. LIGHTHEADED: "Do you feel lightheaded?" (e.g., somewhat faint, woozy, weak upon standing)     Yes does feel faint sometimes 3. VERTIGO: "Do you feel like either you or the room is spinning or tilting?" (i.e. vertigo)     mo 4. SEVERITY: "How bad is it?"  "Do you feel like you are going to faint?" "Can you stand and walk?"   - MILD - walking normally   - MODERATE - interferes with normal activities (e.g., work, school)    - SEVERE - unable to stand, requires support to walk, feels like passing out now.      severe 5. ONSET:  "When did the dizziness begin?"     Since starting medication on 09/18/17 6. AGGRAVATING FACTORS: "Does anything make it worse?" (e.g., standing, change in head position)     None noted 7. HEART RATE: "Can you tell me your heart rate?" "How many beats in 15 seconds?"  (Note: not all patients can do this)       27 beats in 15 seconds 8. CAUSE: "What do you think is causing the dizziness?"     Maybe new BP medication 9. RECURRENT SYMPTOM: "Have you had dizziness before?" If so, ask: "When was the last  time?" "What happened that time?"     09/25/17 at app 1330 10. OTHER SYMPTOMS: "Do you have any other symptoms?" (e.g., fever, chest pain, vomiting, diarrhea, bleeding)       nausea 11. PREGNANCY: "Is there any chance you are pregnant?" "When was your last menstrual period?"       No hysterectomy  Protocols used: DIZZINESS [facility]

## 2017-09-26 ENCOUNTER — Ambulatory Visit (INDEPENDENT_AMBULATORY_CARE_PROVIDER_SITE_OTHER): Payer: No Typology Code available for payment source | Admitting: Family Medicine

## 2017-09-26 ENCOUNTER — Encounter: Payer: Self-pay | Admitting: Family Medicine

## 2017-09-26 VITALS — BP 110/60 | HR 89 | Temp 97.8°F | Wt 193.8 lb

## 2017-09-26 DIAGNOSIS — R42 Dizziness and giddiness: Secondary | ICD-10-CM | POA: Diagnosis not present

## 2017-09-26 DIAGNOSIS — I10 Essential (primary) hypertension: Secondary | ICD-10-CM | POA: Diagnosis not present

## 2017-09-26 LAB — CBC WITH DIFFERENTIAL/PLATELET
BASOS ABS: 0 10*3/uL (ref 0.0–0.1)
BASOS PCT: 0.3 % (ref 0.0–3.0)
EOS ABS: 0.1 10*3/uL (ref 0.0–0.7)
Eosinophils Relative: 1.2 % (ref 0.0–5.0)
HEMATOCRIT: 39 % (ref 36.0–46.0)
HEMOGLOBIN: 13.2 g/dL (ref 12.0–15.0)
LYMPHS PCT: 33.5 % (ref 12.0–46.0)
Lymphs Abs: 2.5 10*3/uL (ref 0.7–4.0)
MCHC: 33.7 g/dL (ref 30.0–36.0)
MCV: 84.4 fl (ref 78.0–100.0)
MONOS PCT: 9 % (ref 3.0–12.0)
Monocytes Absolute: 0.7 10*3/uL (ref 0.1–1.0)
NEUTROS ABS: 4.2 10*3/uL (ref 1.4–7.7)
Neutrophils Relative %: 56 % (ref 43.0–77.0)
PLATELETS: 261 10*3/uL (ref 150.0–400.0)
RBC: 4.62 Mil/uL (ref 3.87–5.11)
RDW: 13.5 % (ref 11.5–15.5)
WBC: 7.5 10*3/uL (ref 4.0–10.5)

## 2017-09-26 LAB — POC URINALSYSI DIPSTICK (AUTOMATED)
Bilirubin, UA: NEGATIVE
Glucose, UA: NEGATIVE
KETONES UA: NEGATIVE
Leukocytes, UA: NEGATIVE
Nitrite, UA: NEGATIVE
Protein, UA: NEGATIVE
RBC UA: NEGATIVE
SPEC GRAV UA: 1.025 (ref 1.010–1.025)
UROBILINOGEN UA: 0.2 U/dL
pH, UA: 6 (ref 5.0–8.0)

## 2017-09-26 LAB — BASIC METABOLIC PANEL
BUN: 19 mg/dL (ref 6–23)
CHLORIDE: 99 meq/L (ref 96–112)
CO2: 28 meq/L (ref 19–32)
CREATININE: 1.14 mg/dL (ref 0.40–1.20)
Calcium: 9.6 mg/dL (ref 8.4–10.5)
GFR: 53.53 mL/min — ABNORMAL LOW (ref >60.00)
Glucose, Bld: 109 mg/dL — ABNORMAL HIGH (ref 70–99)
POTASSIUM: 4.2 meq/L (ref 3.5–5.1)
SODIUM: 135 meq/L (ref 135–145)

## 2017-09-26 LAB — HEPATIC FUNCTION PANEL
ALT: 17 U/L (ref 0–35)
AST: 14 U/L (ref 0–37)
Albumin: 4.5 g/dL (ref 3.5–5.2)
Alkaline Phosphatase: 100 U/L (ref 39–117)
BILIRUBIN DIRECT: 0.1 mg/dL (ref 0.0–0.3)
BILIRUBIN TOTAL: 0.5 mg/dL (ref 0.2–1.2)
TOTAL PROTEIN: 7.4 g/dL (ref 6.0–8.3)

## 2017-09-26 LAB — TSH: TSH: 1.46 u[IU]/mL (ref 0.35–4.50)

## 2017-09-26 NOTE — Progress Notes (Signed)
   Subjective:    Patient ID: Karen Flores, female    DOB: 1966-06-15, 51 y.o.   MRN: [id number]  HPI Here to follow up HTN and for other symptoms. She has been taking Lisinopril HCT and her BP has come down nicely. No more chest pains like the ones that drove her to the ER. However she still feels lightheaded at times, weak, and slightly nauseated. She had some diarrhea last night but she says this is not unusual since she has IBS.    Review of Systems  Constitutional: Negative.   Respiratory: Negative.   Cardiovascular: Negative.   Gastrointestinal: Positive for diarrhea and nausea. Negative for abdominal distention, abdominal pain, anal bleeding, blood in stool, constipation and vomiting.  Genitourinary: Negative.   Neurological: Positive for light-headedness. Negative for dizziness, tremors, seizures, syncope, facial asymmetry, speech difficulty, weakness, numbness and headaches.       Objective:   Physical Exam  Constitutional: She is oriented to person, place, and time. She appears well-developed and well-nourished. No distress.  HENT:  Right Ear: External ear normal.  Left Ear: External ear normal.  Nose: Nose normal.  Mouth/Throat: Oropharynx is clear and moist.  Eyes: Conjunctivae are normal.  Neck: Neck supple. No thyromegaly present.  Cardiovascular: Normal rate, regular rhythm, normal heart sounds and intact distal pulses.  Pulmonary/Chest: Effort normal and breath sounds normal. No respiratory distress. She has no wheezes. She has no rales.  Abdominal: Soft. Bowel sounds are normal. She exhibits no distension and no mass. There is no tenderness. There is no rebound and no guarding.  Lymphadenopathy:    She has no cervical adenopathy.  Neurological: She is alert and oriented to person, place, and time.          Assessment & Plan:  Her HTN is now well controlled. However she does not feel well and no specific etiology is apparent. We will send her for more labs  today. She will rest and drink fluids.  Alysia Penna, MD

## 2017-10-02 ENCOUNTER — Ambulatory Visit: Payer: No Typology Code available for payment source | Admitting: Family Medicine

## 2017-10-16 ENCOUNTER — Other Ambulatory Visit: Payer: Self-pay

## 2017-10-16 MED ORDER — LISINOPRIL-HYDROCHLOROTHIAZIDE 10-12.5 MG PO TABS: 1.0000 | ORAL_TABLET | Freq: Every day | ORAL | 1 refills | Status: DC

## 2017-10-16 NOTE — Telephone Encounter (Signed)
[facility]ax from [facility] request for a 90 day supply for lisinopril-HCTZ. RX sent.

## 2017-11-03 ENCOUNTER — Other Ambulatory Visit: Payer: Self-pay | Admitting: Family Medicine

## 2017-11-03 NOTE — Telephone Encounter (Signed)
Last OV 09/26/2017. Rx is listed on pt's medication. No record of Dr. Sarajane Jews refilling this medication. Sent to PCP for approval.

## 2018-01-01 IMAGING — CR DG CHEST 2V
3 series · 3 of 3 positions shown · non-contrast
Comparison: Radiographs April 17, 2012.

CLINICAL DATA: Chest pain.

EXAM:
CHEST  2 VIEW

[w chest pa]
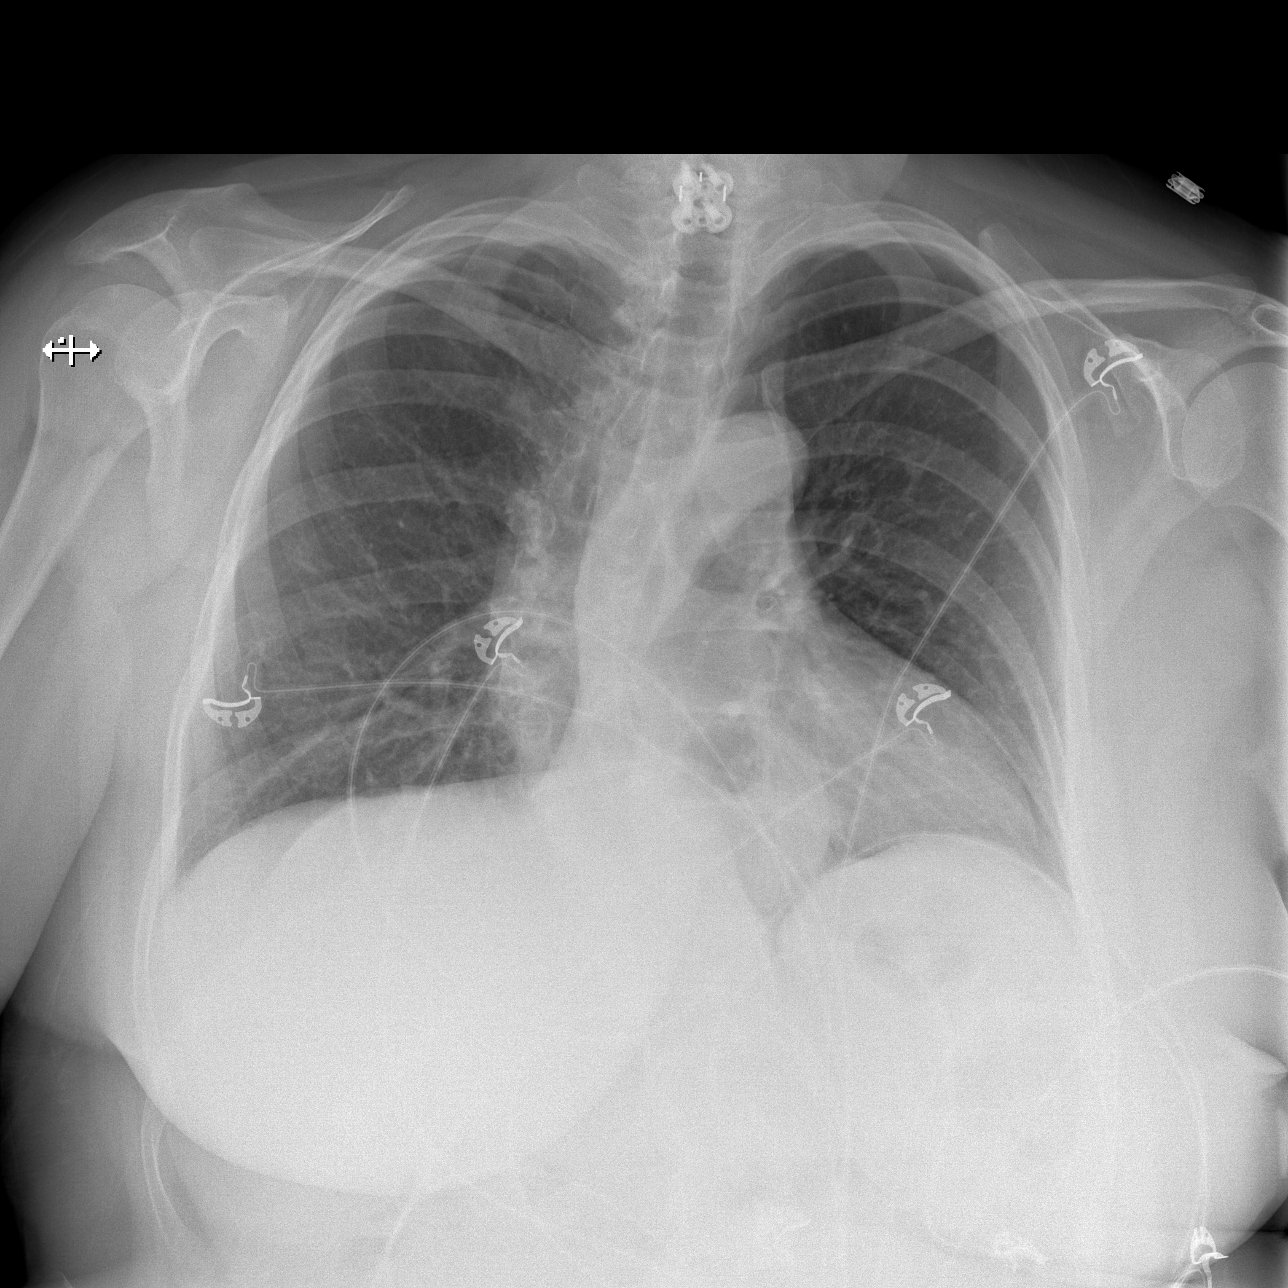

[w chest lat (1 of 2)]
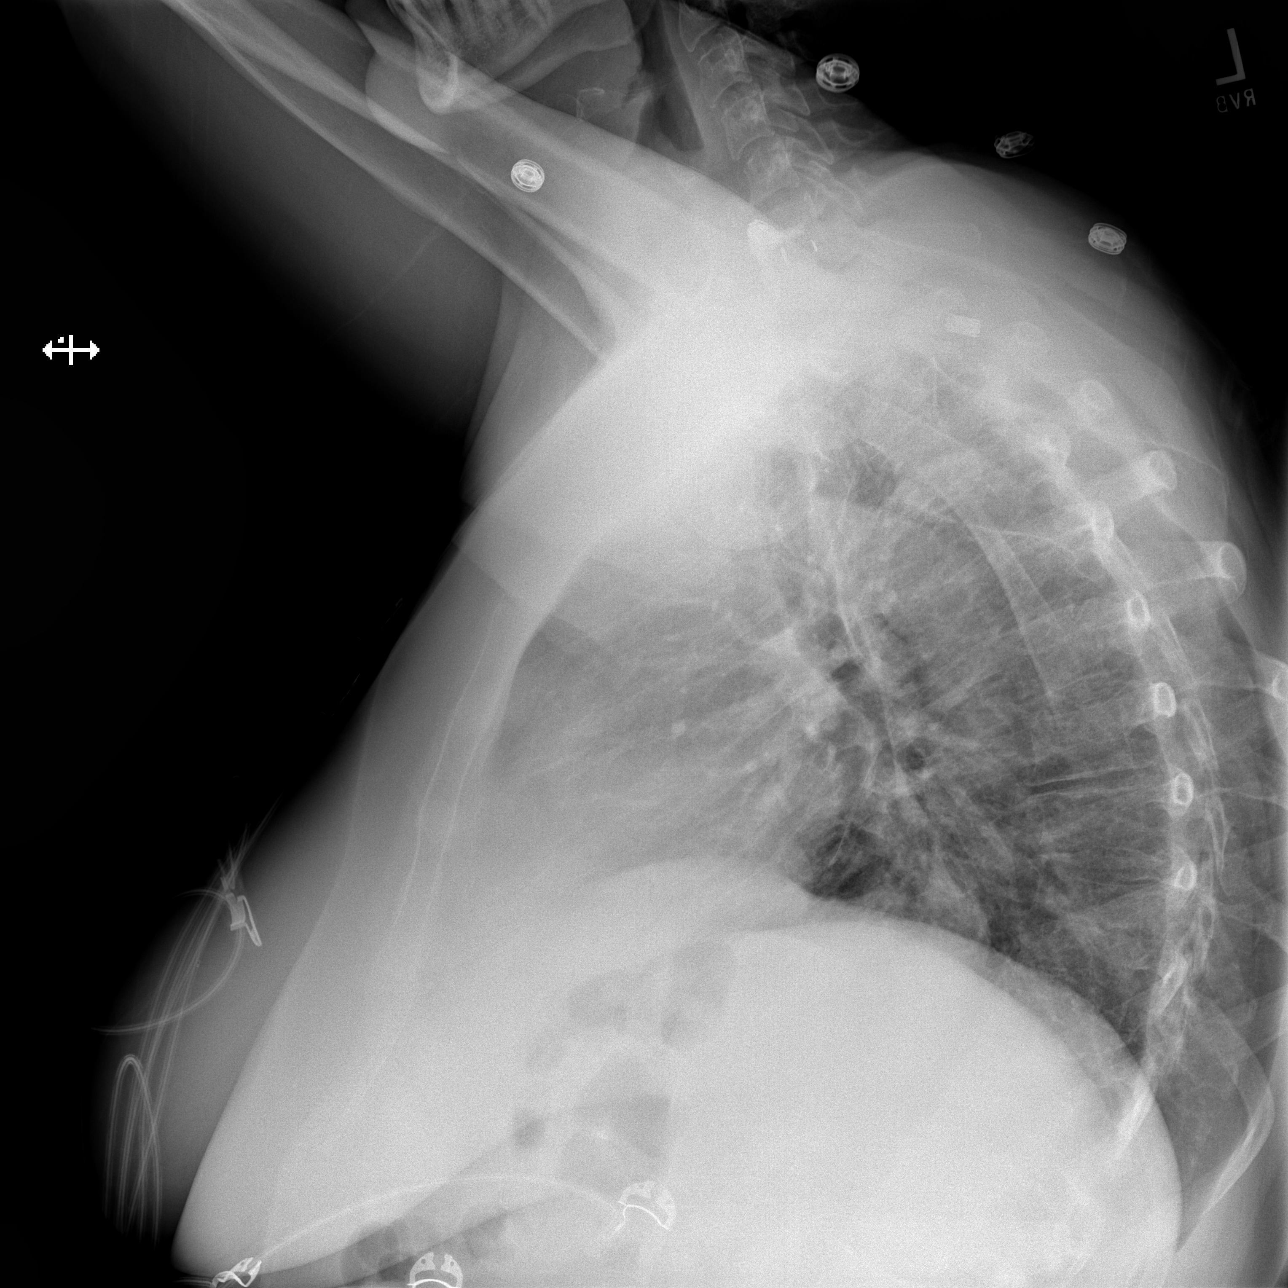

[w chest lat (2 of 2)]
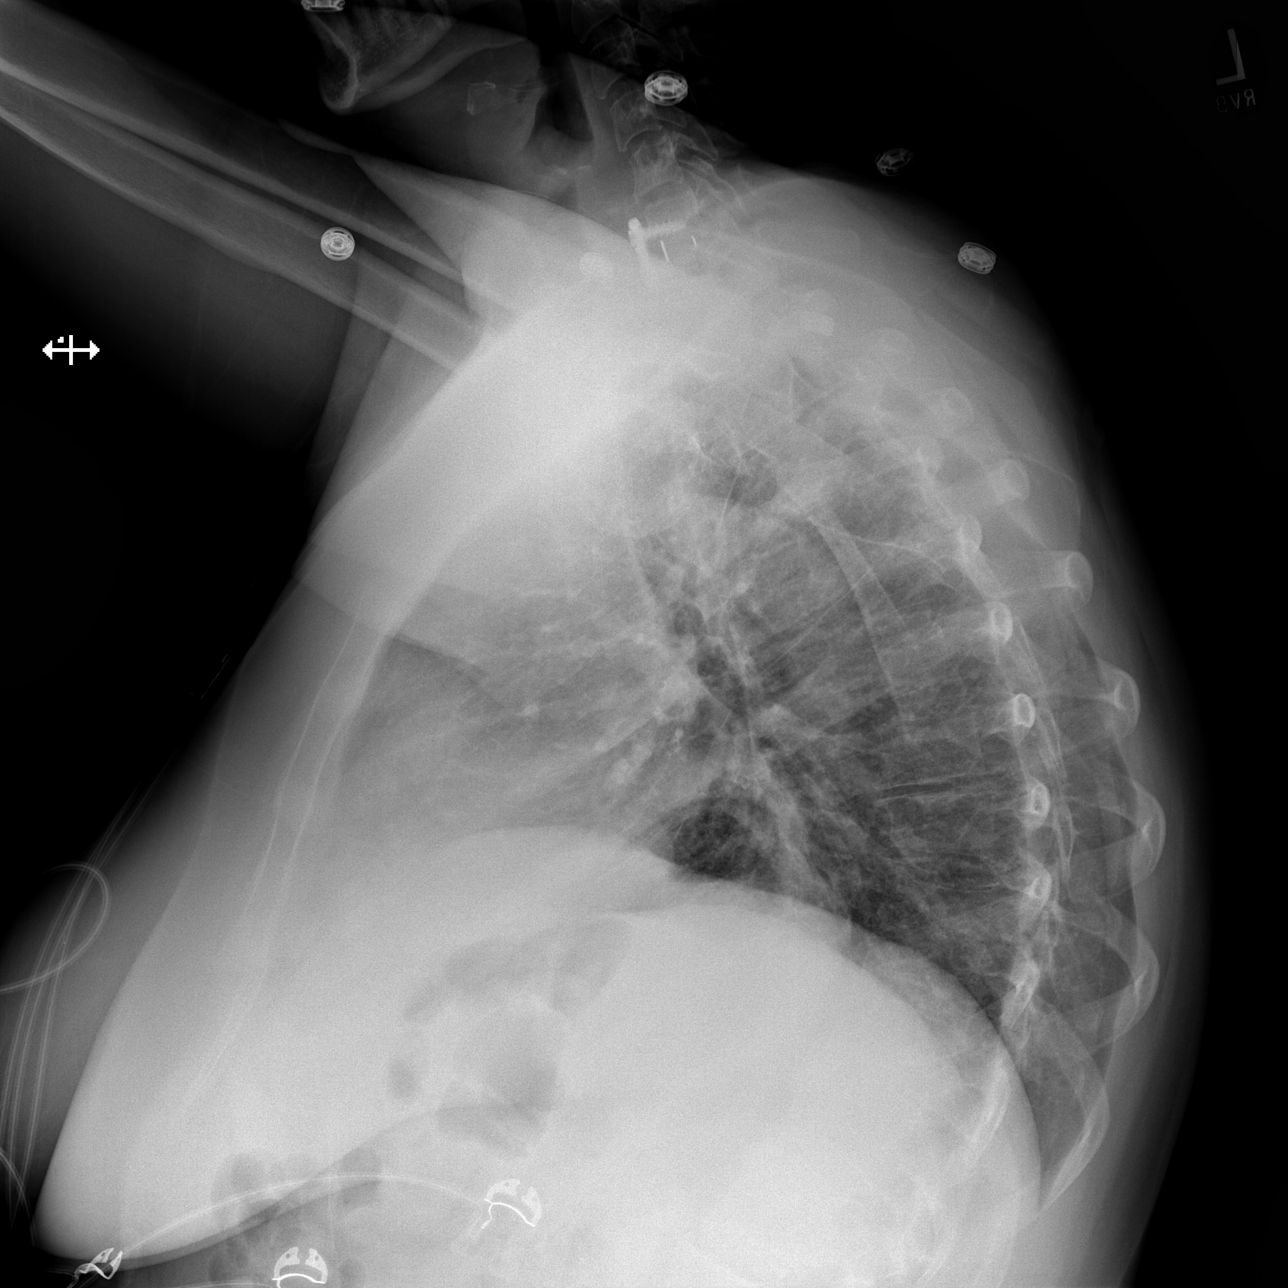

[3 of 3 positions shown; findings below may reference images not displayed]

FINDINGS: The heart size and mediastinal contours are within normal limits.
Both lungs are clear. No pneumothorax or pleural effusion is noted.
Small hiatal hernia is noted. The visualized skeletal structures are
unremarkable.
IMPRESSION: No active cardiopulmonary disease.  Small hiatal hernia.

## 2018-04-03 ENCOUNTER — Other Ambulatory Visit: Payer: Self-pay | Admitting: Family Medicine

## 2018-04-03 NOTE — Telephone Encounter (Signed)
Last OV 09/26/2017   Last refilled 09/18/2017 disp 60 with 2 refills   Sent to PCP for approval

## 2018-04-03 NOTE — Telephone Encounter (Signed)
Call in #60 with 5 rf 

## 2018-05-18 ENCOUNTER — Other Ambulatory Visit: Payer: Self-pay | Admitting: Family Medicine

## 2018-07-18 ENCOUNTER — Encounter: Payer: Self-pay | Admitting: Family Medicine

## 2018-07-18 ENCOUNTER — Ambulatory Visit: Payer: No Typology Code available for payment source | Admitting: Family Medicine

## 2018-07-18 VITALS — BP 132/84 | HR 101 | Temp 98.3°F | Wt 193.4 lb

## 2018-07-18 DIAGNOSIS — I10 Essential (primary) hypertension: Secondary | ICD-10-CM

## 2018-07-18 DIAGNOSIS — J069 Acute upper respiratory infection, unspecified: Secondary | ICD-10-CM | POA: Diagnosis not present

## 2018-07-18 DIAGNOSIS — T464X5A Adverse effect of angiotensin-converting-enzyme inhibitors, initial encounter: Secondary | ICD-10-CM

## 2018-07-18 DIAGNOSIS — R05 Cough: Secondary | ICD-10-CM | POA: Diagnosis not present

## 2018-07-18 DIAGNOSIS — R058 Other specified cough: Secondary | ICD-10-CM

## 2018-07-18 MED ORDER — AMLODIPINE BESYLATE 5 MG PO TABS: 5.0000 mg | ORAL_TABLET | Freq: Every day | ORAL | 3 refills | Status: DC

## 2018-07-18 NOTE — Progress Notes (Signed)
   Subjective:    Patient ID: Karen Flores, female    DOB: [birth year], 52 y.o.   MRN: [id number]  HPI Here for several issues. First for 3 days she has had a ST. This started in the back of the mouth and then spread down to the voice box. Today she feels much better. No fever or PND. Also she has had a mild intermittent tickling cough that she thinks is from her Lisinopril. This started several months ago. Her BP has been very stable.    Review of Systems  Constitutional: Negative.   HENT: Positive for sore throat. Negative for congestion, ear pain, postnasal drip, sinus pressure, sinus pain, trouble swallowing and voice change.   Eyes: Negative.   Respiratory: Positive for cough. Negative for chest tightness, shortness of breath and wheezing.   Cardiovascular: Negative.        Objective:   Physical Exam  Constitutional: She is oriented to person, place, and time. She appears well-developed and well-nourished.  HENT:  Right Ear: External ear normal.  Left Ear: External ear normal.  Nose: Nose normal.  Mouth/Throat: Oropharynx is clear and moist.  Eyes: Conjunctivae are normal.  Neck: Neck supple. No thyromegaly present.  Cardiovascular: Normal rate, regular rhythm, normal heart sounds and intact distal pulses.  Pulmonary/Chest: Effort normal and breath sounds normal. No stridor. No respiratory distress. She has no wheezes. She has no rales.  Lymphadenopathy:    She has no cervical adenopathy.  Neurological: She is alert and oriented to person, place, and time.          Assessment & Plan:  She has a viral URI, and this seems to be resolving. She will drink fluids and recheck prn. Her HTM is stable, but she has an ACE inhibitor associated cough. We will stop the Lisinopril HCT and try Amlodipine 5 mg daily. Recheck one month. Alysia Penna, MD

## 2018-08-23 ENCOUNTER — Other Ambulatory Visit: Payer: Self-pay | Admitting: Family Medicine

## 2018-09-24 ENCOUNTER — Other Ambulatory Visit: Payer: Self-pay | Admitting: Family Medicine

## 2018-10-29 ENCOUNTER — Ambulatory Visit: Payer: Self-pay | Admitting: *Deleted

## 2018-10-29 ENCOUNTER — Encounter: Payer: Self-pay | Admitting: *Deleted

## 2018-10-29 NOTE — Telephone Encounter (Signed)
This encounter was created in error - please disregard.

## 2018-10-29 NOTE — Telephone Encounter (Signed)
Dr. Fry please advise. Thanks  

## 2018-10-29 NOTE — Telephone Encounter (Signed)
Contacted pt regarding her symptoms; she states that her symptoms started around the 10/25/2018 and worsened over the weekend; she sinus and head congestion; she does have a non-productive cough; she also has a headache and sinus pressure around eyes and cheeks, sore and scratchy throat; she rates her sinus pain and throbbing headache at 8-9 out of 10; recommendations made per nurse triage protocol; the pt would like for an antibiotic to be called in for her; [facility] [address] [place], [place]:  spoke with Vita Barley at [facility] and she requests that this information be routed to the office for provider review and final disposition; the pt can be contacted at [phone] and a message can be left; she pt was last seen by Dr Sarajane Jews, [place], 07/18/2018.      Reason for Disposition . [1] Sinus pain (not just congestion) AND [2] fever  Answer Assessment - Initial Assessment Questions 1. LOCATION: "Where does it hurt?"      Behind eyes and bilateral cheeks 2. ONSET: "When did the sinus pain start?"  (e.g., hours, days)      10/25/2018 3. SEVERITY: "How bad is the pain?"   (Scale 1-10; mild, moderate or severe)   - MILD (1-3): doesn't interfere with normal activities    - MODERATE (4-7): interferes with normal activities (e.g., work or school) or awakens from sleep   - SEVERE (8-10): excruciating pain and patient unable to do any normal activities        8-9 out of 10 4. RECURRENT SYMPTOM: "Have you ever had sinus problems before?" If so, ask: "When was the last time?" and "What happened that time?"      yes 5. NASAL CONGESTION: "Is the nose blocked?" If so, ask, "Can you open it or must you breathe through the mouth?"     no 6. NASAL DISCHARGE: "Do you have discharge from your nose?" If so ask, "What color?"     no 7. FEVER: "Do you have a fever?" If so, ask: "What is it, how was it measured, and when did it start?"      Has not taken temperature but feels "feverish and clammy"  10/29/2018 8. OTHER SYMPTOMS: "Do you have any other symptoms?" (e.g., sore throat, cough, earache, difficulty breathing)     Sore and scratchy throat, cough, headache, ears clogged 9. PREGNANCY: "Is there any chance you are pregnant?" "When was your last menstrual period?"     No; hysterectomy  Protocols used: SINUS PAIN OR CONGESTION-A-AH

## 2018-10-29 NOTE — Telephone Encounter (Signed)
Called and lmom for the pt---per Dr. Sarajane Jews he would like to see her in the office.  I advised the pt of this and that she will need to call and schedule an appt.

## 2018-11-19 ENCOUNTER — Other Ambulatory Visit: Payer: Self-pay | Admitting: Family Medicine

## 2018-11-20 NOTE — Telephone Encounter (Signed)
Dr. Fry please advise on refill of medication.  Thanks  

## 2018-11-21 NOTE — Telephone Encounter (Signed)
Call in #60 with 5 rf 

## 2018-11-21 NOTE — Telephone Encounter (Signed)
Refill has been called to the pharmacy and left on the VM.  

## 2018-12-04 DIAGNOSIS — K228 Other specified diseases of esophagus: Secondary | ICD-10-CM | POA: Diagnosis not present

## 2018-12-04 DIAGNOSIS — K648 Other hemorrhoids: Secondary | ICD-10-CM | POA: Diagnosis not present

## 2018-12-04 DIAGNOSIS — Z1211 Encounter for screening for malignant neoplasm of colon: Secondary | ICD-10-CM | POA: Diagnosis not present

## 2018-12-04 DIAGNOSIS — K449 Diaphragmatic hernia without obstruction or gangrene: Secondary | ICD-10-CM | POA: Diagnosis not present

## 2018-12-04 DIAGNOSIS — K635 Polyp of colon: Secondary | ICD-10-CM | POA: Diagnosis not present

## 2018-12-04 DIAGNOSIS — D125 Benign neoplasm of sigmoid colon: Secondary | ICD-10-CM | POA: Diagnosis not present

## 2018-12-04 DIAGNOSIS — K219 Gastro-esophageal reflux disease without esophagitis: Secondary | ICD-10-CM | POA: Diagnosis not present

## 2018-12-04 DIAGNOSIS — D130 Benign neoplasm of esophagus: Secondary | ICD-10-CM | POA: Diagnosis not present

## 2018-12-04 HISTORY — PX: COLONOSCOPY: SHX174

## 2018-12-23 DIAGNOSIS — B001 Herpesviral vesicular dermatitis: Secondary | ICD-10-CM | POA: Diagnosis not present

## 2018-12-23 DIAGNOSIS — I10 Essential (primary) hypertension: Secondary | ICD-10-CM | POA: Diagnosis not present

## 2018-12-24 ENCOUNTER — Ambulatory Visit: Payer: Self-pay

## 2018-12-24 NOTE — Telephone Encounter (Signed)
Dr. Sarajane Jews please advise.   Pt is already on the valtrex---should she come in to see you or see eye doctor?  Thanks

## 2018-12-24 NOTE — Telephone Encounter (Signed)
She needs to see me ASAP to evaluate this

## 2018-12-24 NOTE — Telephone Encounter (Signed)
Outgoing  Call to Patient  Who is complaining of  Having  Shingles around her left side of her head.  Patient  States that rash  Is getting close to her left  Eye.  Voices concern  about keeping  Rash from  Going to her eye.  Wants  To know  What she can  Do to to keep it  From  Spreading to her eye.  Patient  Has started takening,  Valtrex po.  Would  Like  Recommendation.  Patient  Awaits  Phone  Call.        Karen Flores Female, 53 y.o., April 01, 1966 MRN:  [id number] Phone:  [phone] (H) PCP:  Laurey Morale, MD Coverage:  [facility] MULTIPLAN/MULTIPLAN PHCS Message from Nils Flack sent at 12/24/2018 9:24 AM EST   [facility] Pt [facility] meds yesterday. Pt has outbreak near her left eye. It is on eyelid and pt feels is worried it is getting close to the corner of her eye and going inside her eye. Pt wants to know what to do next.  Please call [phone]   Call History    Type Contact  12/24/2018 09:21 AM Phone (Incoming) Karen Flores (Self)  Phone: [phone] (H)  User: Nils Flack    Answer Assessment - Initial Assessment Questions 1. APPEARANCE of RASH: "Describe the rash."      Large red   Not  Oozing  At this point. 2. LOCATION: "Where is the rash located?"      Left eye for head  And  scalp 3. ONSET: "When did the rash start?"      thursday 4. ITCHING: "Does the rash itch?" If so, ask: "How bad is the itch?"  (Scale 1-10; or mild, moderate, severe)     yes 5. PAIN: "Does the rash hurt?" If so, ask: "How bad is the pain?"  (Scale 1-10; or mild, moderate, severe)     Stinging  Really  bad 6. OTHER SYMPTOMS: "Do you have any other symptoms?" (e.g., fever)     Fever 100 low grade 7. PREGNANCY: "Is there any chance you are pregnant?" "When was your last menstrual period?"    na  Protocols used: [facility]

## 2018-12-24 NOTE — Telephone Encounter (Signed)
Please advise if you feel an appt is needed to eval. Thanks.

## 2018-12-25 DIAGNOSIS — B0239 Other herpes zoster eye disease: Secondary | ICD-10-CM | POA: Diagnosis not present

## 2018-12-25 NOTE — Telephone Encounter (Signed)
Called and spoke with pt and she stated that she did see her eye doctor today and they told her that everything is good so far.  She stated that the areas are starting to scab up but she said that the pain is still there.  She stated that she will call back if anything further needed.

## 2018-12-25 NOTE — Telephone Encounter (Signed)
Noted  

## 2019-04-05 ENCOUNTER — Ambulatory Visit (INDEPENDENT_AMBULATORY_CARE_PROVIDER_SITE_OTHER): Payer: BC Managed Care – PPO | Admitting: Family Medicine

## 2019-04-05 ENCOUNTER — Ambulatory Visit: Payer: Self-pay | Admitting: Family Medicine

## 2019-04-05 ENCOUNTER — Other Ambulatory Visit: Payer: Self-pay

## 2019-04-05 ENCOUNTER — Encounter: Payer: Self-pay | Admitting: Family Medicine

## 2019-04-05 DIAGNOSIS — Z20828 Contact with and (suspected) exposure to other viral communicable diseases: Secondary | ICD-10-CM | POA: Diagnosis not present

## 2019-04-05 DIAGNOSIS — Z209 Contact with and (suspected) exposure to unspecified communicable disease: Secondary | ICD-10-CM

## 2019-04-05 NOTE — Telephone Encounter (Signed)
Pt. Reports her daughter was with a friend Tuesday who tested positive yesterday for COVID 19. Pt. Does not have symptoms and neither  Does her daughter. Warm transfer to [place] in the practice for virtual visit.  Answer Assessment - Initial Assessment Questions 1. CLOSE CONTACT: "Who is the person with the confirmed or suspected COVID-19 infection that you were exposed to?"     Pt.'s daughter 2. PLACE of CONTACT: "Where were you when you were exposed to COVID-19?" (e.g., home, school, medical waiting room; which city?)     Lives in same home 3. TYPE of CONTACT: "How much contact was there?" (e.g., sitting next to, live in same house, work in same office, same building)     Home 4. DURATION of CONTACT: "How long were you in contact with the COVID-19 patient?" (e.g., a few seconds, passed by person, a few minutes, live with the patient)     Home 5. DATE of CONTACT: "When did you have contact with a COVID-19 patient?" (e.g., how many days ago)     This week 6. TRAVEL: "Have you traveled out of the country recently?" If so, "When and where?"     * Also ask about out-of-state travel, since the CDC has identified some high-risk cities for community spread in the [place].     * Note: Travel becomes less relevant if there is widespread community transmission where the patient lives.     No 7. COMMUNITY SPREAD: "Are there lots of cases of COVID-19 (community spread) where you live?" (See public health department website, if unsure)       yes 8. SYMPTOMS: "Do you have any symptoms?" (e.g., fever, cough, breathing difficulty)     No 9. PREGNANCY OR POSTPARTUM: "Is there any chance you are pregnant?" "When was your last menstrual period?" "Did you deliver in the last 2 weeks?"     No 10. HIGH RISK: "Do you have any heart or lung problems? Do you have a weak immune system?" (e.g., CHF, COPD, asthma, HIV positive, chemotherapy, renal failure, diabetes mellitus, sickle cell anemia)       HTN  Protocols used:  CORONAVIRUS (COVID-19) EXPOSURE-A-AH

## 2019-04-05 NOTE — Progress Notes (Signed)
Subjective:    Patient ID: Karen Flores, female    DOB: 11/14/1965, 53 y.o.   MRN: [id number]  HPI Virtual Visit via Video Note  I connected with the patient on 04/05/19 at 11:00 AM EDT by a video enabled telemedicine application and verified that I am speaking with the correct person using two identifiers.  Location patient: home Location provider:work or home office Persons participating in the virtual visit: patient, provider  I discussed the limitations of evaluation and management by telemedicine and the availability of in person appointments. The patient expressed understanding and agreed to proceed.   HPI: Here with some questions. Her daughter, age 9, spent 3 days with friends in [place] this week and she came home yesterday. As far as she knows none of her friends felt ill. However she has now learned that one of these friends has tested positive for the Covid-19 virus. Her daughter feel fine, but Karen Flores asks if her daughter or she herself should be tested. Karen Flores also feels fine.    ROS: See pertinent positives and negatives per HPI.  Past Medical History:  Diagnosis Date  . Anxiety   . PONV (postoperative nausea and vomiting)   . Scoliosis     Past Surgical History:  Procedure Laterality Date  . CERVICAL DISC SURGERY  2015  . colonscopy    . HAND SURGERY  as a child and in 11/2009    x 2, Left wrist surgery with plates and screws  . LAPAROSCOPIC SUPRACERVICAL HYSTERECTOMY  11/29/2011   Procedure: LAPAROSCOPIC SUPRACERVICAL HYSTERECTOMY;  Surgeon: Clarene Duke, MD;  Location: [facility];  Service: Gynecology;  Laterality: N/A;  . SPINE SURGERY    . SVD     x 2  . WISDOM TOOTH EXTRACTION      History reviewed. No pertinent family history.   Current Outpatient Medications:  .  acetaminophen (TYLENOL) 500 MG tablet, Take 500 mg by mouth every 6 (six) hours as needed for mild pain., Disp: , Rfl:  .  amLODipine (NORVASC) 5 MG tablet, Take 1 tablet (5 mg total)  by mouth daily., Disp: 90 tablet, Rfl: 3 .  diazepam (VALIUM) 5 MG tablet, TAKE 1 TABLET BY MOUTH EVERY 12 HOURS AS NEEDED FOR ANXIETY, Disp: 60 tablet, Rfl: 5 .  lisinopril-hydrochlorothiazide (PRINZIDE,ZESTORETIC) 10-12.5 MG tablet, TAKE 1 TABLET BY MOUTH EVERY DAY, Disp: 90 tablet, Rfl: 0 .  omeprazole (PRILOSEC) 40 MG capsule, TAKE 1 CAPSULE (40 MG TOTAL) BY MOUTH DAILY., Disp: 90 capsule, Rfl: 3  EXAM:  VITALS per patient if applicable:  GENERAL: alert, oriented, appears well and in no acute distress  HEENT: atraumatic, conjunttiva clear, no obvious abnormalities on inspection of external nose and ears  NECK: normal movements of the head and neck  LUNGS: on inspection no signs of respiratory distress, breathing rate appears normal, no obvious gross SOB, gasping or wheezing  CV: no obvious cyanosis  MS: moves all visible extremities without noticeable abnormality  PSYCH/NEURO: pleasant and cooperative, no obvious depression or anxiety, speech and thought processing grossly intact  ASSESSMENT AND PLAN: Possible exposure to Covid-19. I advised Karen Flores to tell her daughter to go to an urgent care to be tested, but to wait until next week. I told Karen Flores she should wait and see how her daughter's test comes out. If this is negative and both of them still have no symptoms by next week, then Karen Flores would not need to be tested. If Karen Flores gets symptoms, she will let  us know.  Karen Penna, MD  Discussed the following assessment and plan:  No diagnosis found.     I discussed the assessment and treatment plan with the patient. The patient was provided an opportunity to ask questions and all were answered. The patient agreed with the plan and demonstrated an understanding of the instructions.   The patient was advised to call back or seek an in-person evaluation if the symptoms worsen or if the condition fails to improve as anticipated.     Review of Systems     Objective:   Physical Exam         Assessment & Plan:

## 2019-04-05 NOTE — Telephone Encounter (Signed)
Pt scheduled with PCP

## 2019-05-15 DIAGNOSIS — Z1231 Encounter for screening mammogram for malignant neoplasm of breast: Secondary | ICD-10-CM | POA: Diagnosis not present

## 2019-08-15 ENCOUNTER — Other Ambulatory Visit: Payer: Self-pay | Admitting: Family Medicine

## 2019-09-23 ENCOUNTER — Other Ambulatory Visit: Payer: Self-pay | Admitting: Family Medicine

## 2019-09-25 NOTE — Telephone Encounter (Signed)
Patient medication request is in a pending status . She states she needs her medication before tomorrow.

## 2019-12-09 ENCOUNTER — Other Ambulatory Visit: Payer: Self-pay | Admitting: Family Medicine

## 2019-12-09 NOTE — Telephone Encounter (Signed)
Last filled 11/21/2018 Last OV 04/05/2019  Ok to fill?

## 2020-01-02 DIAGNOSIS — Z23 Encounter for immunization: Secondary | ICD-10-CM | POA: Diagnosis not present

## 2020-02-01 DIAGNOSIS — Z23 Encounter for immunization: Secondary | ICD-10-CM | POA: Diagnosis not present

## 2020-05-20 DIAGNOSIS — Z1231 Encounter for screening mammogram for malignant neoplasm of breast: Secondary | ICD-10-CM | POA: Diagnosis not present

## 2020-05-21 DIAGNOSIS — H524 Presbyopia: Secondary | ICD-10-CM | POA: Diagnosis not present

## 2020-05-21 DIAGNOSIS — H53143 Visual discomfort, bilateral: Secondary | ICD-10-CM | POA: Diagnosis not present

## 2020-06-01 DIAGNOSIS — R928 Other abnormal and inconclusive findings on diagnostic imaging of breast: Secondary | ICD-10-CM | POA: Diagnosis not present

## 2020-07-10 ENCOUNTER — Other Ambulatory Visit: Payer: Self-pay | Admitting: Family Medicine

## 2020-07-14 NOTE — Telephone Encounter (Signed)
reviewed in Dr Murrell Redden absence  Last visit was  6 2020   Over a year ago for controlled substance. Request deferred to dr Sarajane Jews  Suggest may a visit of some type for med check

## 2020-07-15 ENCOUNTER — Other Ambulatory Visit: Payer: BC Managed Care – PPO

## 2020-07-15 ENCOUNTER — Other Ambulatory Visit: Payer: Self-pay

## 2020-07-15 DIAGNOSIS — Z20822 Contact with and (suspected) exposure to covid-19: Secondary | ICD-10-CM | POA: Diagnosis not present

## 2020-07-16 LAB — NOVEL CORONAVIRUS, NAA: SARS-CoV-2, NAA: NOT DETECTED

## 2020-07-16 LAB — SARS-COV-2, NAA 2 DAY TAT

## 2020-07-22 ENCOUNTER — Encounter: Payer: Self-pay | Admitting: Family Medicine

## 2020-07-22 ENCOUNTER — Telehealth (INDEPENDENT_AMBULATORY_CARE_PROVIDER_SITE_OTHER): Payer: BC Managed Care – PPO | Admitting: Family Medicine

## 2020-07-22 VITALS — Wt 165.0 lb

## 2020-07-22 DIAGNOSIS — I10 Essential (primary) hypertension: Secondary | ICD-10-CM | POA: Diagnosis not present

## 2020-07-22 DIAGNOSIS — F419 Anxiety disorder, unspecified: Secondary | ICD-10-CM | POA: Insufficient documentation

## 2020-07-22 MED ORDER — DIAZEPAM 5 MG PO TABS: 5.0000 mg | ORAL_TABLET | Freq: Two times a day (BID) | ORAL | 5 refills | Status: DC | PRN

## 2020-07-22 MED ORDER — AMLODIPINE BESYLATE 5 MG PO TABS: 5.0000 mg | ORAL_TABLET | Freq: Every day | ORAL | 3 refills | Status: DC

## 2020-07-22 NOTE — Telephone Encounter (Signed)
She will need an OV for this  

## 2020-07-22 NOTE — Progress Notes (Signed)
   Subjective:    Patient ID: Karen Flores, female    DOB: [birth year], 54 y.o.   MRN: [id number]  HPI Virtual Visit via Telephone Note  I connected with the patient on 07/22/20 at  3:00 PM EDT by telephone and verified that I am speaking with the correct person using two identifiers.   I discussed the limitations, risks, security and privacy concerns of performing an evaluation and management service by telephone and the availability of in person appointments. I also discussed with the patient that there may be a patient responsible charge related to this service. The patient expressed understanding and agreed to proceed.  Location patient: home Location provider: work or home office Participants present for the call: patient, provider Patient did not have a visit in the prior 7 days to address this/these issue(s).   History of Present Illness: Here to follow up on anxiety and HTN. She feels well. Her anxiety is controlled. Her BP at home averages in the 120s over 70s.    Observations/Objective: Patient sounds cheerful and well on the phone. I do not appreciate any SOB. Speech and thought processing are grossly intact. Patient reported vitals:  Assessment and Plan: Her anxiety and HTN are stable. The Amlodipine and Valium are refilled.  Alysia Penna, MD   Follow Up Instructions:     [phone] 5-10 [phone] 11-20 [phone] 21-30 I did not refer this patient for an OV in the next 24 hours for this/these issue(s).  I discussed the assessment and treatment plan with the patient. The patient was provided an opportunity to ask questions and all were answered. The patient agreed with the plan and demonstrated an understanding of the instructions.   The patient was advised to call back or seek an in-person evaluation if the symptoms worsen or if the condition fails to improve as anticipated.  I provided 11 minutes of non-face-to-face time during this encounter.   Alysia Penna, MD     Review of Systems     Objective:   Physical Exam        Assessment & Plan:

## 2020-08-20 DIAGNOSIS — L814 Other melanin hyperpigmentation: Secondary | ICD-10-CM | POA: Diagnosis not present

## 2020-08-20 DIAGNOSIS — L819 Disorder of pigmentation, unspecified: Secondary | ICD-10-CM | POA: Diagnosis not present

## 2020-08-20 DIAGNOSIS — D1801 Hemangioma of skin and subcutaneous tissue: Secondary | ICD-10-CM | POA: Diagnosis not present

## 2020-08-20 DIAGNOSIS — D229 Melanocytic nevi, unspecified: Secondary | ICD-10-CM | POA: Diagnosis not present

## 2020-08-20 DIAGNOSIS — L570 Actinic keratosis: Secondary | ICD-10-CM | POA: Diagnosis not present

## 2020-09-29 ENCOUNTER — Other Ambulatory Visit: Payer: Self-pay | Admitting: Family Medicine

## 2020-10-30 ENCOUNTER — Telehealth: Payer: Self-pay | Admitting: Family Medicine

## 2020-10-30 NOTE — Telephone Encounter (Signed)
Pt is calling in stating that since her mother Ms. Norberta Keens (Dr. Barbie Banner pt) passed on Oct 10, 2020 she is not able to sleep or eat she would like to see if she can get something called in to calm her nerves.  Pt is aware that she would need to make an appointment she is going to look at her calendar to see when she is able to do one virtually and give the office a all back.

## 2020-10-30 NOTE — Telephone Encounter (Signed)
FYI

## 2020-11-03 ENCOUNTER — Telehealth (INDEPENDENT_AMBULATORY_CARE_PROVIDER_SITE_OTHER): Payer: BC Managed Care – PPO | Admitting: Family Medicine

## 2020-11-03 ENCOUNTER — Encounter: Payer: Self-pay | Admitting: Family Medicine

## 2020-11-03 VITALS — BP 114/78 | Temp 97.9°F

## 2020-11-03 DIAGNOSIS — F4320 Adjustment disorder, unspecified: Secondary | ICD-10-CM

## 2020-11-03 DIAGNOSIS — F4321 Adjustment disorder with depressed mood: Secondary | ICD-10-CM

## 2020-11-03 MED ORDER — TEMAZEPAM 30 MG PO CAPS: 30.0000 mg | ORAL_CAPSULE | Freq: Every evening | ORAL | 0 refills | Status: DC | PRN

## 2020-11-03 NOTE — Progress Notes (Signed)
   Subjective:    Patient ID: Karen Flores, female    DOB: 1966-03-19, 55 y.o.   MRN: [id number]  HPI Virtual Visit via Telephone Note  I connected with the patient on 11/03/20 at  1:00 PM EST by telephone and verified that I am speaking with the correct person using two identifiers.   I discussed the limitations, risks, security and privacy concerns of performing an evaluation and management service by telephone and the availability of in person appointments. I also discussed with the patient that there may be a patient responsible charge related to this service. The patient expressed understanding and agreed to proceed.  Location patient: home Location provider: work or home office Participants present for the call: patient, provider Patient did not have a visit in the prior 7 days to address this/these issue(s).   History of Present Illness: Here asking for help with sleep. Her mother passed away suddenly and she has been unable to sleep since then. She takes Diazepam during the day and this helps, but it is not helpful for sleep. She wants to try something on a short term basis to get through this time.    Observations/Objective: Patient sounds cheerful and well on the phone. I do not appreciate any SOB. Speech and thought processing are grossly intact. Patient reported vitals:  Assessment and Plan: Insomnia as part of grief. She will try Temazepam 30 mg qhs. I also suggested she talk to a therapist, and she will consider this.  Alysia Penna, MD   Follow Up Instructions:     [phone] 5-10 [phone] 11-20 [phone] 21-30 I did not refer this patient for an OV in the next 24 hours for this/these issue(s).  I discussed the assessment and treatment plan with the patient. The patient was provided an opportunity to ask questions and all were answered. The patient agreed with the plan and demonstrated an understanding of the instructions.   The patient was advised to call back or seek an  in-person evaluation if the symptoms worsen or if the condition fails to improve as anticipated.  I provided 11 minutes of non-face-to-face time during this encounter.   Alysia Penna, MD    Review of Systems     Objective:   Physical Exam        Assessment & Plan:

## 2020-11-13 ENCOUNTER — Telehealth: Payer: Self-pay | Admitting: Family Medicine

## 2020-11-13 MED ORDER — ZOLPIDEM TARTRATE 10 MG PO TABS: 10.0000 mg | ORAL_TABLET | Freq: Every evening | ORAL | 2 refills | Status: DC | PRN

## 2020-11-13 NOTE — Telephone Encounter (Signed)
Pt is calling in stating that the Rx temazepam (RESTORIL) 30 MG is not working and would like to see if there is something else that she can try.  Pharm:  [facility] [place] on [address].

## 2020-11-13 NOTE — Telephone Encounter (Signed)
Patient informed of the message below.

## 2020-11-13 NOTE — Telephone Encounter (Signed)
I sent in some Zolpidem (Ambien) to try instead

## 2020-12-01 DIAGNOSIS — L570 Actinic keratosis: Secondary | ICD-10-CM | POA: Diagnosis not present

## 2020-12-01 DIAGNOSIS — L819 Disorder of pigmentation, unspecified: Secondary | ICD-10-CM | POA: Diagnosis not present

## 2020-12-08 DIAGNOSIS — Z6833 Body mass index (BMI) 33.0-33.9, adult: Secondary | ICD-10-CM | POA: Diagnosis not present

## 2020-12-08 DIAGNOSIS — Z1389 Encounter for screening for other disorder: Secondary | ICD-10-CM | POA: Diagnosis not present

## 2020-12-08 DIAGNOSIS — Z01419 Encounter for gynecological examination (general) (routine) without abnormal findings: Secondary | ICD-10-CM | POA: Diagnosis not present

## 2020-12-08 DIAGNOSIS — Z130 Encounter for screening for diseases of the blood and blood-forming organs and certain disorders involving the immune mechanism: Secondary | ICD-10-CM | POA: Diagnosis not present

## 2021-01-21 DIAGNOSIS — N644 Mastodynia: Secondary | ICD-10-CM | POA: Diagnosis not present

## 2021-03-02 DIAGNOSIS — N644 Mastodynia: Secondary | ICD-10-CM | POA: Diagnosis not present

## 2021-04-05 DIAGNOSIS — L821 Other seborrheic keratosis: Secondary | ICD-10-CM | POA: Diagnosis not present

## 2021-04-05 DIAGNOSIS — L309 Dermatitis, unspecified: Secondary | ICD-10-CM | POA: Diagnosis not present

## 2021-04-05 DIAGNOSIS — D229 Melanocytic nevi, unspecified: Secondary | ICD-10-CM | POA: Diagnosis not present

## 2021-04-05 DIAGNOSIS — L814 Other melanin hyperpigmentation: Secondary | ICD-10-CM | POA: Diagnosis not present

## 2021-04-05 DIAGNOSIS — L570 Actinic keratosis: Secondary | ICD-10-CM | POA: Diagnosis not present

## 2021-05-04 ENCOUNTER — Encounter (HOSPITAL_COMMUNITY): Payer: Self-pay

## 2021-05-04 ENCOUNTER — Emergency Department (HOSPITAL_COMMUNITY)
Admission: EM | Admit: 2021-05-04 | Discharge: 2021-05-04 | Disposition: A | Discharge status: Home / Self Care | Payer: BC Managed Care – PPO | Attending: Emergency Medicine | Admitting: Emergency Medicine

## 2021-05-04 ENCOUNTER — Other Ambulatory Visit: Payer: Self-pay

## 2021-05-04 ENCOUNTER — Emergency Department (HOSPITAL_COMMUNITY): Payer: BC Managed Care – PPO

## 2021-05-04 DIAGNOSIS — D509 Iron deficiency anemia, unspecified: Secondary | ICD-10-CM | POA: Diagnosis not present

## 2021-05-04 DIAGNOSIS — I10 Essential (primary) hypertension: Secondary | ICD-10-CM | POA: Diagnosis not present

## 2021-05-04 DIAGNOSIS — K449 Diaphragmatic hernia without obstruction or gangrene: Secondary | ICD-10-CM | POA: Diagnosis not present

## 2021-05-04 DIAGNOSIS — R0789 Other chest pain: Secondary | ICD-10-CM | POA: Diagnosis not present

## 2021-05-04 DIAGNOSIS — Z79899 Other long term (current) drug therapy: Secondary | ICD-10-CM | POA: Insufficient documentation

## 2021-05-04 DIAGNOSIS — R079 Chest pain, unspecified: Secondary | ICD-10-CM | POA: Diagnosis not present

## 2021-05-04 DIAGNOSIS — D649 Anemia, unspecified: Secondary | ICD-10-CM | POA: Diagnosis not present

## 2021-05-04 LAB — TROPONIN I (HIGH SENSITIVITY)
Troponin I (High Sensitivity): <2 ng/L (ref <18)
Troponin I (High Sensitivity): 2 ng/L (ref <18)

## 2021-05-04 LAB — POC OCCULT BLOOD, ED: Fecal Occult Bld: NEGATIVE

## 2021-05-04 LAB — CBC
HCT: 31.1 % — ABNORMAL LOW (ref 36.0–46.0)
Hemoglobin: 8.9 g/dL — ABNORMAL LOW (ref 12.0–15.0)
MCH: 21.1 pg — ABNORMAL LOW (ref 26.0–34.0)
MCHC: 28.6 g/dL — ABNORMAL LOW (ref 30.0–36.0)
MCV: 73.7 fL — ABNORMAL LOW (ref 80.0–100.0)
Platelets: 288 10*3/uL (ref 150–400)
RBC: 4.22 MIL/uL (ref 3.87–5.11)
RDW: 17.8 % — ABNORMAL HIGH (ref 11.5–15.5)
WBC: 5.9 10*3/uL (ref 4.0–10.5)
nRBC: 0 % (ref 0.0–0.2)

## 2021-05-04 LAB — RETICULOCYTES
Immature Retic Fract: 21.4 % — ABNORMAL HIGH (ref 2.3–15.9)
RBC.: 4.08 MIL/uL (ref 3.87–5.11)
Retic Count, Absolute: 60.8 10*3/uL (ref 19.0–186.0)
Retic Ct Pct: 1.5 % (ref 0.4–3.1)

## 2021-05-04 LAB — FOLATE: Folate: 8.2 ng/mL (ref >5.9)

## 2021-05-04 LAB — IRON AND TIBC
Iron: 24 ug/dL — ABNORMAL LOW (ref 28–170)
Saturation Ratios: 5 % — ABNORMAL LOW (ref 10.4–31.8)
TIBC: 530 ug/dL — ABNORMAL HIGH (ref 250–450)
UIBC: 506 ug/dL

## 2021-05-04 LAB — BASIC METABOLIC PANEL
Anion gap: 8 (ref 5–15)
BUN: 15 mg/dL (ref 6–20)
CO2: 25 mmol/L (ref 22–32)
Calcium: 9.4 mg/dL (ref 8.9–10.3)
Chloride: 107 mmol/L (ref 98–111)
Creatinine, Ser: 0.96 mg/dL (ref 0.44–1.00)
GFR, Estimated: >60 mL/min (ref >60)
Glucose, Bld: 100 mg/dL — ABNORMAL HIGH (ref 70–99)
Potassium: 4.1 mmol/L (ref 3.5–5.1)
Sodium: 140 mmol/L (ref 135–145)

## 2021-05-04 LAB — FERRITIN: Ferritin: 3 ng/mL — ABNORMAL LOW (ref 11–307)

## 2021-05-04 LAB — VITAMIN B12: Vitamin B-12: 78 pg/mL — ABNORMAL LOW (ref 180–914)

## 2021-05-04 MED ORDER — FERROUS SULFATE 325 (65 FE) MG PO TABS: 325.0000 mg | ORAL_TABLET | Freq: Every day | ORAL | 0 refills | Status: DC

## 2021-05-04 NOTE — ED Triage Notes (Signed)
Patient c/o mid chest pain that started approx 1 hour ago. Patient states she has had SOB when she climbs stairs for approx 2 months. Patient denies any N/v.

## 2021-05-04 NOTE — ED Provider Notes (Signed)
Emergency Medicine Provider Triage Evaluation Note  Karen Flores , a 55 y.o. female  was evaluated in triage.  Pt complains of intermittent chest pain.  She states her symptoms have been ongoing for the past 1 to 2 months.  It typically occur daily.  She states that sometimes her chest pain will last all day.  It radiates from her left ribs across the left anterior chest.  Describes it as an aching pain.  Sometimes it alleviates when taking Advil.  No worsening factors that she can think of.  Reports associated shortness of breath and notes that she has had more exertional dyspnea for the past 2 months since this started.  Reports a history of hypertension.  No history of sudden cardiac death in any first-degree relatives.  Denies any drug use.  She states that a loved one recently passed away and she has been much more stressed recently.  Physical Exam  BP (!) 136/98 (BP Location: Left Arm)   Pulse (!) 102   Temp 98.8 F (37.1 C) (Oral)   Resp 16   Ht 5\' 4"  (1.626 m)   Wt 79.4 kg   LMP 11/16/2011   SpO2 99%   BMI 30.04 kg/m  Gen:   Awake, no distress   Resp:  Normal effort  MSK:   Moves extremities without difficulty  Other:    Medical Decision Making  Medically screening exam initiated at 2:26 PM.  Appropriate orders placed.  Karen Flores was informed that the remainder of the evaluation will be completed by another provider, this initial triage assessment does not replace that evaluation, and the importance of remaining in the ED until their evaluation is complete.   Karen Sexton, PA-C 05/04/21 [place], [place], DO 05/04/21 1532

## 2021-05-04 NOTE — Discharge Instructions (Addendum)
Follow-up with your primary care doctor to check on your iron and B12 levels and to evaluate your anemia further.  Start taking the iron tablets as prescribed.

## 2021-05-04 NOTE — ED Provider Notes (Signed)
[facility] DEPT Provider Note   CSN: [id number] Arrival date & time: 05/04/21  1355     History Chief Complaint  Patient presents with   Chest Pain    Karen Flores is a 55 y.o. female.   Chest Pain  HPI: A 55 year old patient with a history of hypertension presents for evaluation of chest pain. Initial onset of pain was more than 6 hours ago. The patient's chest pain is not worse with exertion. The patient reports some diaphoresis. The patient's chest pain is middle- or left-sided, is not well-localized, is not described as heaviness/pressure/tightness, is not sharp and does radiate to the arms/jaw/neck. The patient does not complain of nausea. The patient has no history of stroke, has no history of peripheral artery disease, has not smoked in the past 90 days, denies any history of treated diabetes, has no relevant family history of coronary artery disease (first degree relative at less than age 74), has no history of hypercholesterolemia and does not have an elevated BMI (>=30).  Patient has noticed that she has been getting more fatigued when she walks around.  She also gets more symptoms with exertion Past Medical History:  Diagnosis Date   Anxiety    PONV (postoperative nausea and vomiting)    Scoliosis     Patient Active Problem List   Diagnosis Date Noted   Anxiety disorder 07/22/2020   Essential hypertension 09/18/2017   Esophageal reflux 02/02/2015   Chronic neck pain 02/02/2015   Fibroids 11/30/2011   INSOMNIA 07/06/2010   IBS 05/02/2008    Past Surgical History:  Procedure Laterality Date   CERVICAL DISC SURGERY  2015   colonscopy     HAND SURGERY  as a child and in 11/2009    x 2, Left wrist surgery with plates and screws   LAPAROSCOPIC SUPRACERVICAL HYSTERECTOMY  11/29/2011   Procedure: LAPAROSCOPIC SUPRACERVICAL HYSTERECTOMY;  Surgeon: Clarene Duke, MD;  Location: [facility];  Service: Gynecology;  Laterality: N/A;   SPINE  SURGERY     SVD     x 2   WISDOM TOOTH EXTRACTION       OB History   No obstetric history on file.     Family History  Problem Relation Age of Onset   Hypertension Mother    Stroke Mother    Alzheimer's disease Father     Social History   Tobacco Use   Smoking status: Never   Smokeless tobacco: Never  Vaping Use   Vaping Use: Never used  Substance Use Topics   Alcohol use: Yes    Alcohol/week: 0.0 standard drinks    Comment: few times per month   Drug use: No    Home Medications Prior to Admission medications   Medication Sig Start Date End Date Taking? Authorizing Provider  ferrous sulfate 325 (65 FE) MG tablet Take 1 tablet (325 mg total) by mouth daily. 05/04/21  Yes Dorie Rank, MD  acetaminophen (TYLENOL) 500 MG tablet Take 500 mg by mouth every 6 (six) hours as needed for mild pain.    [provider]  amLODipine (NORVASC) 5 MG tablet Take 1 tablet (5 mg total) by mouth daily. 07/22/20   Laurey Morale, MD  diazepam (VALIUM) 5 MG tablet Take 1 tablet (5 mg total) by mouth every 12 (twelve) hours as needed. 07/22/20   Laurey Morale, MD  omeprazole (PRILOSEC) 40 MG capsule TAKE 1 CAPSULE (40 MG TOTAL) BY MOUTH DAILY. 09/29/20   Sarajane Jews,  Ishmael Holter, MD  temazepam (RESTORIL) 30 MG capsule Take 1 capsule (30 mg total) by mouth at bedtime as needed for sleep. 11/03/20   Laurey Morale, MD  zolpidem (AMBIEN) 10 MG tablet Take 1 tablet (10 mg total) by mouth at bedtime as needed for sleep. 11/13/20   Laurey Morale, MD    Allergies    Lisinopril and Sulfacetamide sodium  Review of Systems   Review of Systems  Cardiovascular:  Positive for chest pain.  All other systems reviewed and are negative.  Physical Exam Updated Vital Signs BP 118/86   Pulse 70   Temp 98.8 F (37.1 C) (Oral)   Resp 18   Ht 1.626 m (5\' 4" )   Wt 79.4 kg   LMP 11/16/2011   SpO2 100%   BMI 30.04 kg/m   Physical Exam Vitals and nursing note reviewed.  Constitutional:      General: She  is not in acute distress.    Appearance: She is well-developed.  HENT:     Head: Normocephalic and atraumatic.     Right Ear: External ear normal.     Left Ear: External ear normal.  Eyes:     General: No scleral icterus.       Right eye: No discharge.        Left eye: No discharge.     Conjunctiva/sclera: Conjunctivae normal.  Neck:     Trachea: No tracheal deviation.  Cardiovascular:     Rate and Rhythm: Normal rate and regular rhythm.  Pulmonary:     Effort: Pulmonary effort is normal. No respiratory distress.     Breath sounds: Normal breath sounds. No stridor. No wheezing or rales.  Abdominal:     General: Bowel sounds are normal. There is no distension.     Palpations: Abdomen is soft.     Tenderness: There is no abdominal tenderness. There is no guarding or rebound.  Musculoskeletal:        General: No tenderness or deformity.     Cervical back: Neck supple.  Skin:    General: Skin is warm and dry.     Findings: No rash.  Neurological:     General: No focal deficit present.     Mental Status: She is alert.     Cranial Nerves: No cranial nerve deficit (no facial droop, extraocular movements intact, no slurred speech).     Sensory: No sensory deficit.     Motor: No abnormal muscle tone or seizure activity.     Coordination: Coordination normal.  Psychiatric:        Mood and Affect: Mood normal.    ED Results / Procedures / Treatments   Labs (all labs ordered are listed, but only abnormal results are displayed) Labs Reviewed  BASIC METABOLIC PANEL - Abnormal; Notable for the following components:      Result Value   Glucose, Bld 100 (*)    All other components within normal limits  CBC - Abnormal; Notable for the following components:   Hemoglobin 8.9 (*)    HCT 31.1 (*)    MCV 73.7 (*)    MCH 21.1 (*)    MCHC 28.6 (*)    RDW 17.8 (*)    All other components within normal limits  VITAMIN B12 - Abnormal; Notable for the following components:   Vitamin B-12  78 (*)    All other components within normal limits  IRON AND TIBC - Abnormal; Notable for the following components:  Iron 24 (*)    TIBC 530 (*)    Saturation Ratios 5 (*)    All other components within normal limits  FERRITIN - Abnormal; Notable for the following components:   Ferritin 3 (*)    All other components within normal limits  RETICULOCYTES - Abnormal; Notable for the following components:   Immature Retic Fract 21.4 (*)    All other components within normal limits  FOLATE  POC OCCULT BLOOD, ED  TROPONIN I (HIGH SENSITIVITY)  TROPONIN I (HIGH SENSITIVITY)    EKG EKG Interpretation  Date/Time:  Tuesday May 04 2021 14:11:46 EDT Ventricular Rate:  101 PR Interval:  146 QRS Duration: 76 QT Interval:  340 QTC Calculation: 440 R Axis:   25 Text Interpretation: Sinus tachycardia Otherwise normal ECG Since last tracing rate slower Confirmed by Dorie Rank [phone]) on 05/04/2021 7:36:39 PM  Radiology DG Chest 2 View  Result Date: 05/04/2021 CLINICAL DATA:  Chest pain EXAM: CHEST - 2 VIEW COMPARISON:  09/16/2017 FINDINGS: Moderate-sized hiatal hernia. Heart and mediastinal contours are within normal limits. No focal opacities or effusions. No acute bony abnormality. Thoracolumbar scoliosis. IMPRESSION: No active cardiopulmonary disease. Moderate-sized hiatal hernia. Electronically Signed   By: Rolm Baptise M.D.   On: 05/04/2021 14:35    Procedures Procedures   Medications Ordered in ED Medications - No data to display  ED Course  I have reviewed the triage vital signs and the nursing notes.  Pertinent labs & imaging results that were available during my care of the patient were reviewed by me and considered in my medical decision making (see chart for details).    MDM Rules/Calculators/A&P HEAR Score: 3                        Patient presented to the ED with complaints of chest pain.  Symptoms atypical for ACS.  Low risk heart score.  Troponin is normal.  I doubt  that her symptoms are related to acute coronary syndrome.  Patient's labs however do show that she is anemic.  I suspect this is the cause of her fatigue and decreased exercise tolerance.  This is a significant decline from previous values.  Patient's Hemoccult is negative and she does not have any signs of active GI bleeding.  Patient's had a hysterectomy so she does not have any GYN losses.  It is possible she has had occult blood loss.  I have ordered anemia panel.  I will start her on iron tablets.  Low MCV suggest iron deficiency anemia.  We will have her follow-up with her primary care doctor to discuss further evaluation, possible GI work-up. Final Clinical Impression(s) / ED Diagnoses Final diagnoses:  Iron deficiency anemia, unspecified iron deficiency anemia type    Rx / DC Orders ED Discharge Orders          Ordered    ferrous sulfate 325 (65 FE) MG tablet  Daily        05/04/21 2219             Dorie Rank, MD 05/04/21 2222

## 2021-05-06 ENCOUNTER — Other Ambulatory Visit: Payer: Self-pay

## 2021-05-06 ENCOUNTER — Ambulatory Visit (INDEPENDENT_AMBULATORY_CARE_PROVIDER_SITE_OTHER): Payer: BC Managed Care – PPO | Admitting: Family Medicine

## 2021-05-06 ENCOUNTER — Encounter: Payer: Self-pay | Admitting: Family Medicine

## 2021-05-06 VITALS — BP 128/90 | HR 89 | Temp 98.3°F | Wt 198.6 lb

## 2021-05-06 DIAGNOSIS — D509 Iron deficiency anemia, unspecified: Secondary | ICD-10-CM | POA: Diagnosis not present

## 2021-05-06 DIAGNOSIS — E538 Deficiency of other specified B group vitamins: Secondary | ICD-10-CM | POA: Insufficient documentation

## 2021-05-06 MED ORDER — FERROUS SULFATE 325 (65 FE) MG PO TABS: 325.0000 mg | ORAL_TABLET | Freq: Two times a day (BID) | ORAL | 11 refills | Status: DC

## 2021-05-06 MED ORDER — CYANOCOBALAMIN 1000 MCG/ML IJ SOLN
1000.0000 ug | Freq: Once | INTRAMUSCULAR | Status: AC
  Administered 2021-05-06: 1000 ug via INTRAMUSCULAR

## 2021-05-06 NOTE — Progress Notes (Signed)
   Subjective:    Patient ID: Karen Flores, female    DOB: [birth year], 55 y.o.   MRN: [id number]  HPI Here to follow up an ER visit on 05-04-21 for chest pains. She also admitted to feeling very fatigued and to having some SOB on exertion for the past few months. Her exam was normal. CXR was normal except for a moderate sized hiatal hernia. EKG showed only sinus tachycardia. Her labs were remarkable for a Hgb of 8.9 (it was 13 a few years ago), iron was 24, ferritin was 3, and B12 was 78. It was felt that her symptoms were all attributable to the anemia. She was started in an iron pill daily and told to see [place]. She admits to eating very little food because she is trying to lose weight. She never eats red meat. She does not take vitamins. She also notes that she has taken Omeprazole daily for several years for heartburn. She has never seen any dark stools and her hemocult test was negative.   Review of Systems  Constitutional:  Positive for fatigue.  Respiratory:  Positive for shortness of breath. Negative for cough and wheezing.   Cardiovascular:  Positive for chest pain. Negative for palpitations and leg swelling.  Gastrointestinal: Negative.   Genitourinary: Negative.   Neurological: Negative.       Objective:   Physical Exam Constitutional:      Appearance: She is obese. She is not ill-appearing.  Cardiovascular:     Rate and Rhythm: Normal rate and regular rhythm.     Pulses: Normal pulses.     Heart sounds: Normal heart sounds.  Pulmonary:     Effort: Pulmonary effort is normal.     Breath sounds: Normal breath sounds.  Abdominal:     General: Abdomen is flat. Bowel sounds are normal. There is no distension.     Palpations: Abdomen is soft. There is no mass.     Tenderness: There is no abdominal tenderness. There is no guarding or rebound.     Hernia: No hernia is present.  Lymphadenopathy:     Cervical: No cervical adenopathy.  Neurological:     Mental Status: She is alert.           Assessment & Plan:  Mulitfactorial anemia. We will increase her iron pills to BID. Given a B12 shots today and she will get weekly b12 shots for 12 weeks. In 90 days we will recheck a CBC. Iron, ferritin, and B12 level. We spent 35 minutes reviewing records and discussing these issues.  Alysia Penna, MD

## 2021-05-06 NOTE — Addendum Note (Signed)
Addended by: Wyvonne Lenz on: 05/06/2021 09:50 AM   Modules accepted: Orders

## 2021-05-11 ENCOUNTER — Ambulatory Visit (INDEPENDENT_AMBULATORY_CARE_PROVIDER_SITE_OTHER): Payer: BC Managed Care – PPO

## 2021-05-11 ENCOUNTER — Other Ambulatory Visit: Payer: Self-pay

## 2021-05-11 DIAGNOSIS — E538 Deficiency of other specified B group vitamins: Secondary | ICD-10-CM | POA: Diagnosis not present

## 2021-05-11 MED ORDER — CYANOCOBALAMIN 1000 MCG/ML IJ SOLN
1000.0000 ug | Freq: Once | INTRAMUSCULAR | Status: AC
  Administered 2021-05-11: 1000 ug via INTRAMUSCULAR

## 2021-05-11 NOTE — Progress Notes (Signed)
Per orders of Laurey Morale, MD, injection of B12 given in  Right deltoid by Seidy Labreck D Duanne Duchesne. Patient tolerated injection well.  Lab Results  Component Value Date   VITAMINB12 78 (L) 05/04/2021

## 2021-05-17 ENCOUNTER — Ambulatory Visit (INDEPENDENT_AMBULATORY_CARE_PROVIDER_SITE_OTHER): Payer: BC Managed Care – PPO

## 2021-05-17 ENCOUNTER — Other Ambulatory Visit: Payer: Self-pay

## 2021-05-17 DIAGNOSIS — E538 Deficiency of other specified B group vitamins: Secondary | ICD-10-CM

## 2021-05-17 MED ORDER — CYANOCOBALAMIN 1000 MCG/ML IJ SOLN
1000.0000 ug | Freq: Once | INTRAMUSCULAR | Status: AC
  Administered 2021-05-17: 1000 ug via INTRAMUSCULAR

## 2021-05-17 NOTE — Progress Notes (Signed)
Per orders of Laurey Morale, MD, injection of B12 given in   deltoid by Daimion Adamcik D Carsen Leaf. Patient tolerated injection well.  Lab Results  Component Value Date   VITAMINB12 78 (L) 05/04/2021

## 2021-05-25 ENCOUNTER — Other Ambulatory Visit: Payer: Self-pay

## 2021-05-25 ENCOUNTER — Ambulatory Visit (INDEPENDENT_AMBULATORY_CARE_PROVIDER_SITE_OTHER): Payer: BC Managed Care – PPO | Admitting: Family Medicine

## 2021-05-25 ENCOUNTER — Encounter: Payer: Self-pay | Admitting: Family Medicine

## 2021-05-25 ENCOUNTER — Ambulatory Visit: Payer: BC Managed Care – PPO

## 2021-05-25 VITALS — BP 116/80 | HR 99 | Temp 97.9°F | Wt 196.0 lb

## 2021-05-25 DIAGNOSIS — J3089 Other allergic rhinitis: Secondary | ICD-10-CM | POA: Diagnosis not present

## 2021-05-25 DIAGNOSIS — E538 Deficiency of other specified B group vitamins: Secondary | ICD-10-CM | POA: Diagnosis not present

## 2021-05-25 DIAGNOSIS — R590 Localized enlarged lymph nodes: Secondary | ICD-10-CM | POA: Diagnosis not present

## 2021-05-25 MED ORDER — DIAZEPAM 5 MG PO TABS: 5.0000 mg | ORAL_TABLET | Freq: Two times a day (BID) | ORAL | 5 refills | Status: DC | PRN

## 2021-05-25 MED ORDER — CEPHALEXIN 500 MG PO CAPS: 500.0000 mg | ORAL_CAPSULE | Freq: Three times a day (TID) | ORAL | 0 refills | Status: AC

## 2021-05-25 MED ORDER — CYANOCOBALAMIN 1000 MCG/ML IJ SOLN
1000.0000 ug | Freq: Once | INTRAMUSCULAR | Status: AC
  Administered 2021-05-25: 1000 ug via INTRAMUSCULAR

## 2021-05-25 NOTE — Progress Notes (Signed)
   Subjective:    Patient ID: Karen Flores, female    DOB: 1966-02-18, 55 y.o.   MRN: [id number]  HPI Here for several issues. First she has had allergy symptoms like stuffy head, runny nose, and sneezing for 3 months. No fever or cough. She is taking an OTC antihistamine daily. Also for the past 2 months she has had intermittent mild pain in the left anterior neck area just below the jaw. No ear pain. Sometimes is hurts slightly to swallow but not to chew. She had a normal dental exam a few months ago.    Review of Systems  Constitutional: Negative.   HENT:  Positive for congestion, postnasal drip, rhinorrhea and sneezing. Negative for ear pain, facial swelling, hearing loss and sore throat.   Eyes: Negative.   Respiratory: Negative.        Objective:   Physical Exam Constitutional:      Appearance: Normal appearance. She is not ill-appearing.  HENT:     Right Ear: Tympanic membrane, ear canal and external ear normal.     Left Ear: Tympanic membrane, ear canal and external ear normal.     Nose: Nose normal.     Mouth/Throat:     Pharynx: Oropharynx is clear.  Eyes:     Conjunctiva/sclera: Conjunctivae normal.  Neck:     Comments: There is a single small tender lymph node below the left jaw, just anterior to the ear. No TMJ tenderness.  Pulmonary:     Effort: Pulmonary effort is normal.     Breath sounds: Normal breath sounds.  Neurological:     Mental Status: She is alert.          Assessment & Plan:  For her allergies, she will add a daily nasal spray like Flonase. For the inflamed lymph node, we will treat with 10 days of Keflex. She will follow up as needed.  Alysia Penna, MD

## 2021-05-26 DIAGNOSIS — Z1231 Encounter for screening mammogram for malignant neoplasm of breast: Secondary | ICD-10-CM | POA: Diagnosis not present

## 2021-05-31 ENCOUNTER — Other Ambulatory Visit: Payer: Self-pay

## 2021-06-01 ENCOUNTER — Ambulatory Visit (INDEPENDENT_AMBULATORY_CARE_PROVIDER_SITE_OTHER): Payer: BC Managed Care – PPO

## 2021-06-01 DIAGNOSIS — E538 Deficiency of other specified B group vitamins: Secondary | ICD-10-CM

## 2021-06-01 MED ORDER — CYANOCOBALAMIN 1000 MCG/ML IJ SOLN
1000.0000 ug | Freq: Once | INTRAMUSCULAR | Status: AC
  Administered 2021-06-01: 1000 ug via INTRAMUSCULAR

## 2021-06-01 NOTE — Progress Notes (Signed)
Per orders of Dr. Fry, injection of Cyanocobalamin 1000 mcg given by Nyjah Denio L Offie Waide. °Patient tolerated injection well.  °

## 2021-06-08 ENCOUNTER — Ambulatory Visit (INDEPENDENT_AMBULATORY_CARE_PROVIDER_SITE_OTHER): Payer: BC Managed Care – PPO

## 2021-06-08 ENCOUNTER — Other Ambulatory Visit: Payer: Self-pay

## 2021-06-08 DIAGNOSIS — E538 Deficiency of other specified B group vitamins: Secondary | ICD-10-CM

## 2021-06-08 MED ORDER — CYANOCOBALAMIN 1000 MCG/ML IJ SOLN
1000.0000 ug | Freq: Once | INTRAMUSCULAR | Status: AC
  Administered 2021-06-08: 1000 ug via INTRAMUSCULAR

## 2021-06-08 NOTE — Progress Notes (Signed)
Per orders from Dr Fry, pt was given B12 injection by Sakiyah Shur, pt tolerated well.  

## 2021-06-16 ENCOUNTER — Other Ambulatory Visit: Payer: Self-pay

## 2021-06-17 ENCOUNTER — Ambulatory Visit (INDEPENDENT_AMBULATORY_CARE_PROVIDER_SITE_OTHER): Payer: BC Managed Care – PPO

## 2021-06-17 DIAGNOSIS — E538 Deficiency of other specified B group vitamins: Secondary | ICD-10-CM

## 2021-06-17 MED ORDER — CYANOCOBALAMIN 1000 MCG/ML IJ SOLN
1000.0000 ug | Freq: Once | INTRAMUSCULAR | Status: AC
  Administered 2021-06-17: 1000 ug via INTRAMUSCULAR

## 2021-06-17 NOTE — Progress Notes (Signed)
Per orders of Dr. Fry, injection of B12 given by Yaritsa Savarino. Patient tolerated injection well.  

## 2021-06-24 ENCOUNTER — Ambulatory Visit (INDEPENDENT_AMBULATORY_CARE_PROVIDER_SITE_OTHER): Payer: BC Managed Care – PPO

## 2021-06-24 ENCOUNTER — Other Ambulatory Visit: Payer: Self-pay

## 2021-06-24 DIAGNOSIS — E538 Deficiency of other specified B group vitamins: Secondary | ICD-10-CM

## 2021-06-24 MED ORDER — CYANOCOBALAMIN 1000 MCG/ML IJ SOLN
1000.0000 ug | Freq: Once | INTRAMUSCULAR | Status: AC
  Administered 2021-06-24: 1000 ug via INTRAMUSCULAR

## 2021-06-24 NOTE — Progress Notes (Signed)
Per orders of Dr. Fry, injection of Cyanocobalamin 1000 mcg given by Gia Lusher L Yoltzin Ransom. °Patient tolerated injection well.  °

## 2021-06-30 ENCOUNTER — Ambulatory Visit (INDEPENDENT_AMBULATORY_CARE_PROVIDER_SITE_OTHER): Payer: BC Managed Care – PPO

## 2021-06-30 ENCOUNTER — Other Ambulatory Visit: Payer: Self-pay

## 2021-06-30 DIAGNOSIS — E538 Deficiency of other specified B group vitamins: Secondary | ICD-10-CM | POA: Diagnosis not present

## 2021-06-30 MED ORDER — CYANOCOBALAMIN 1000 MCG/ML IJ SOLN
1000.0000 ug | Freq: Once | INTRAMUSCULAR | Status: AC
  Administered 2021-06-30: 1000 ug via INTRAMUSCULAR

## 2021-06-30 NOTE — Patient Instructions (Signed)
Health Maintenance Due  Topic Date Due   COVID-19 Vaccine (1) Never done   HIV Screening  Never done   Hepatitis C Screening  Never done   TETANUS/TDAP  Never done   PAP SMEAR-Modifier  Never done   MAMMOGRAM  Never done   INFLUENZA VACCINE  05/24/2021    No flowsheet data found.

## 2021-06-30 NOTE — Progress Notes (Signed)
Per orders of Laurey Morale, MD, injection of B12 given in left deltoid by Franco Collet. Patient tolerated injection well.  **PT will schedule 90 day lab appt. Can labs be placed for pt?**  Lab Results  Component Value Date   VITAMINB12 78 (L) 05/04/2021

## 2021-07-08 ENCOUNTER — Telehealth: Payer: Self-pay

## 2021-07-08 ENCOUNTER — Ambulatory Visit (INDEPENDENT_AMBULATORY_CARE_PROVIDER_SITE_OTHER): Payer: BC Managed Care – PPO

## 2021-07-08 ENCOUNTER — Other Ambulatory Visit: Payer: Self-pay

## 2021-07-08 DIAGNOSIS — E538 Deficiency of other specified B group vitamins: Secondary | ICD-10-CM

## 2021-07-08 DIAGNOSIS — D509 Iron deficiency anemia, unspecified: Secondary | ICD-10-CM

## 2021-07-08 MED ORDER — CYANOCOBALAMIN 1000 MCG/ML IJ SOLN
1000.0000 ug | Freq: Once | INTRAMUSCULAR | Status: AC
  Administered 2021-07-08: 1000 ug via INTRAMUSCULAR

## 2021-07-08 NOTE — Telephone Encounter (Signed)
At 05/06/21 office visit, its stated to recheck labs in 90 days.    Please advise if okay to schedule lab appointment only at end of October.

## 2021-07-08 NOTE — Telephone Encounter (Signed)
Patient was seen in office for a nurse visit and stated that she had tried to make an appointment for follow up labs and was told she needed to see Dr. Sarajane Jews not just labs. She would like to know if her appointment should be a lab appointment or should she have an appointment with Dr. Sarajane Jews.

## 2021-07-09 NOTE — Addendum Note (Signed)
Addended by: Otilio Miu on: 07/09/2021 03:22 PM   Modules accepted: Orders

## 2021-07-09 NOTE — Telephone Encounter (Signed)
This should be for labs only

## 2021-07-09 NOTE — Telephone Encounter (Signed)
Lvm for patient to call back to schedule lab appointment only for around end of October.   Lab orders have been placed

## 2021-07-12 NOTE — Progress Notes (Signed)
Coding requested I send this for a cosign.

## 2021-07-14 ENCOUNTER — Other Ambulatory Visit: Payer: Self-pay

## 2021-07-14 ENCOUNTER — Ambulatory Visit (INDEPENDENT_AMBULATORY_CARE_PROVIDER_SITE_OTHER): Payer: BC Managed Care – PPO | Admitting: *Deleted

## 2021-07-14 DIAGNOSIS — E538 Deficiency of other specified B group vitamins: Secondary | ICD-10-CM | POA: Diagnosis not present

## 2021-07-14 MED ORDER — CYANOCOBALAMIN 1000 MCG/ML IJ SOLN
1000.0000 ug | Freq: Once | INTRAMUSCULAR | Status: AC
  Administered 2021-07-14: 1000 ug via INTRAMUSCULAR

## 2021-07-14 NOTE — Progress Notes (Signed)
Per orders of Dr. Fry, injection of B12 given by Hideko Esselman. Patient tolerated injection well. 

## 2021-07-16 ENCOUNTER — Ambulatory Visit: Payer: BC Managed Care – PPO

## 2021-07-22 ENCOUNTER — Ambulatory Visit (INDEPENDENT_AMBULATORY_CARE_PROVIDER_SITE_OTHER): Payer: BC Managed Care – PPO

## 2021-07-22 ENCOUNTER — Other Ambulatory Visit: Payer: Self-pay

## 2021-07-22 DIAGNOSIS — E538 Deficiency of other specified B group vitamins: Secondary | ICD-10-CM

## 2021-07-22 MED ORDER — CYANOCOBALAMIN 1000 MCG/ML IJ SOLN
1000.0000 ug | Freq: Once | INTRAMUSCULAR | Status: AC
  Administered 2021-07-22: 1000 ug via INTRAMUSCULAR

## 2021-07-22 NOTE — Progress Notes (Signed)
Per orders from Dr Fry, pt was given B12 injection by Leafy Motsinger, pt tolerated well.  

## 2021-07-27 ENCOUNTER — Other Ambulatory Visit: Payer: Self-pay

## 2021-07-28 ENCOUNTER — Ambulatory Visit (INDEPENDENT_AMBULATORY_CARE_PROVIDER_SITE_OTHER): Payer: BC Managed Care – PPO

## 2021-07-28 DIAGNOSIS — E538 Deficiency of other specified B group vitamins: Secondary | ICD-10-CM

## 2021-07-28 MED ORDER — CYANOCOBALAMIN 1000 MCG/ML IJ SOLN
1000.0000 ug | Freq: Once | INTRAMUSCULAR | Status: AC
  Administered 2021-07-28: 1000 ug via INTRAMUSCULAR

## 2021-07-28 NOTE — Progress Notes (Signed)
Per orders of Dr. Fry, injection of Cyanocobalamin 1,000 mcg/mL given by Cherril Hett N Sevyn Markham. Patient tolerated injection well.  

## 2021-08-19 IMAGING — CR DG CHEST 2V
2 series · 2 of 2 positions shown · non-contrast
Comparison: 09/16/2017

CLINICAL DATA: Chest pain

EXAM:
CHEST - 2 VIEW

[w chest pa]
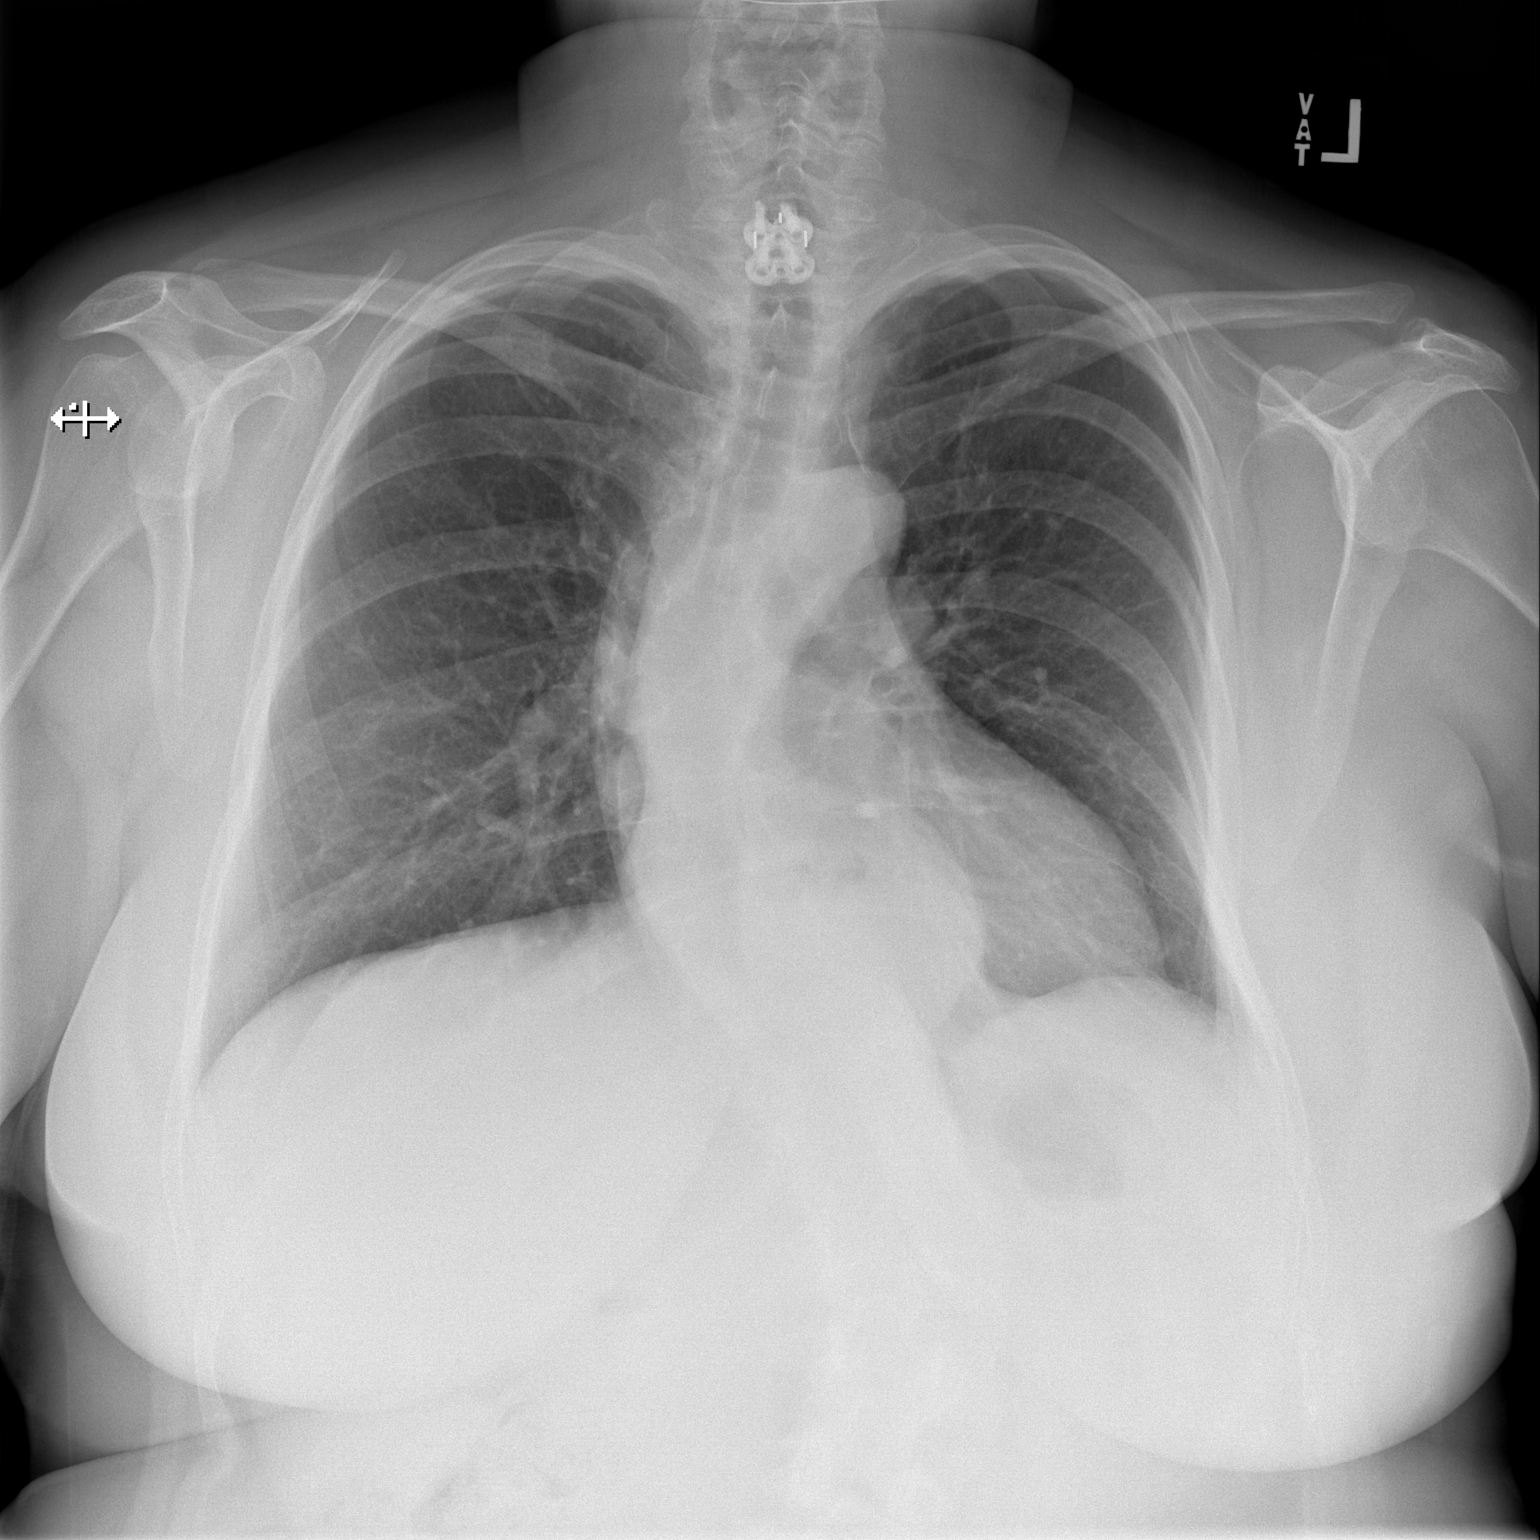

[w chest lat]
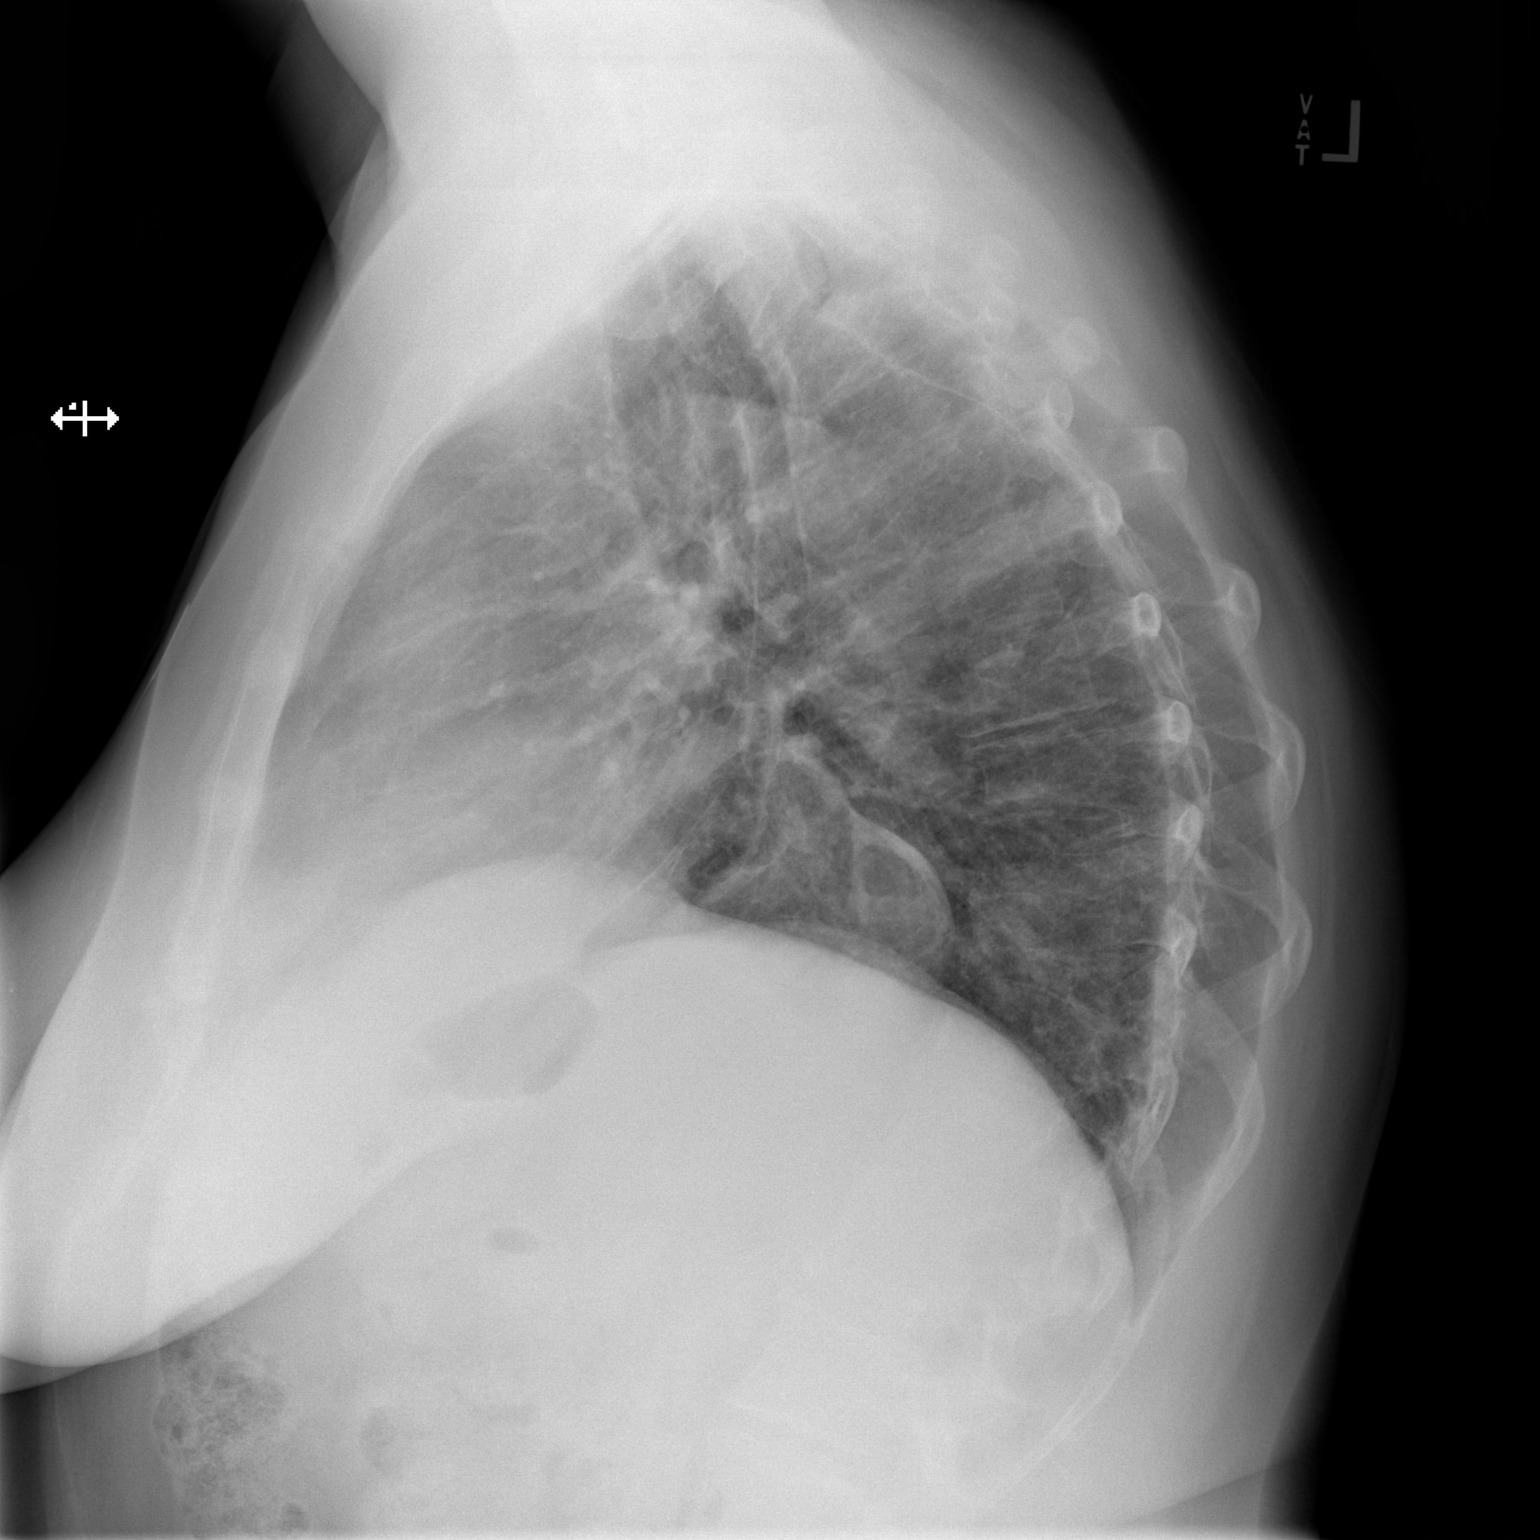

[2 of 2 positions shown; findings below may reference images not displayed]

FINDINGS: Moderate-sized hiatal hernia. Heart and mediastinal contours are
within normal limits. No focal opacities or effusions. No acute bony
abnormality. Thoracolumbar scoliosis.
IMPRESSION: No active cardiopulmonary disease.

Moderate-sized hiatal hernia.

## 2021-08-23 ENCOUNTER — Other Ambulatory Visit (INDEPENDENT_AMBULATORY_CARE_PROVIDER_SITE_OTHER): Payer: BC Managed Care – PPO

## 2021-08-23 ENCOUNTER — Other Ambulatory Visit: Payer: Self-pay

## 2021-08-23 DIAGNOSIS — D509 Iron deficiency anemia, unspecified: Secondary | ICD-10-CM

## 2021-08-23 DIAGNOSIS — E538 Deficiency of other specified B group vitamins: Secondary | ICD-10-CM

## 2021-08-23 LAB — CBC
HCT: 41.1 % (ref 36.0–46.0)
Hemoglobin: 13.5 g/dL (ref 12.0–15.0)
MCHC: 32.9 g/dL (ref 30.0–36.0)
MCV: 88.6 fl (ref 78.0–100.0)
Platelets: 243 10*3/uL (ref 150.0–400.0)
RBC: 4.64 Mil/uL (ref 3.87–5.11)
RDW: 13.2 % (ref 11.5–15.5)
WBC: 6.1 10*3/uL (ref 4.0–10.5)

## 2021-08-23 LAB — IRON: Iron: 126 ug/dL (ref 42–145)

## 2021-08-23 LAB — VITAMIN B12: Vitamin B-12: 272 pg/mL (ref 211–911)

## 2021-08-23 LAB — FERRITIN: Ferritin: 16.6 ng/mL (ref 10.0–291.0)

## 2021-08-24 DIAGNOSIS — D225 Melanocytic nevi of trunk: Secondary | ICD-10-CM | POA: Diagnosis not present

## 2021-08-24 DIAGNOSIS — L819 Disorder of pigmentation, unspecified: Secondary | ICD-10-CM | POA: Diagnosis not present

## 2021-08-24 DIAGNOSIS — L814 Other melanin hyperpigmentation: Secondary | ICD-10-CM | POA: Diagnosis not present

## 2021-08-24 DIAGNOSIS — L821 Other seborrheic keratosis: Secondary | ICD-10-CM | POA: Diagnosis not present

## 2021-08-30 ENCOUNTER — Telehealth: Payer: Self-pay | Admitting: Family Medicine

## 2021-08-30 NOTE — Telephone Encounter (Signed)
Patient missed the call about lab results     Good callback number is [phone]    Please advise

## 2021-08-31 NOTE — Telephone Encounter (Signed)
Spoke with patient, message from lab results given.

## 2021-09-01 ENCOUNTER — Other Ambulatory Visit: Payer: Self-pay | Admitting: Family Medicine

## 2021-09-08 DIAGNOSIS — E538 Deficiency of other specified B group vitamins: Secondary | ICD-10-CM | POA: Diagnosis not present

## 2021-09-08 DIAGNOSIS — K449 Diaphragmatic hernia without obstruction or gangrene: Secondary | ICD-10-CM | POA: Diagnosis not present

## 2021-09-08 DIAGNOSIS — D509 Iron deficiency anemia, unspecified: Secondary | ICD-10-CM | POA: Diagnosis not present

## 2021-09-08 DIAGNOSIS — K219 Gastro-esophageal reflux disease without esophagitis: Secondary | ICD-10-CM | POA: Diagnosis not present

## 2021-11-27 ENCOUNTER — Other Ambulatory Visit: Payer: Self-pay | Admitting: Family Medicine

## 2021-11-27 DIAGNOSIS — I10 Essential (primary) hypertension: Secondary | ICD-10-CM

## 2021-12-09 DIAGNOSIS — Z01419 Encounter for gynecological examination (general) (routine) without abnormal findings: Secondary | ICD-10-CM | POA: Diagnosis not present

## 2021-12-09 DIAGNOSIS — N951 Menopausal and female climacteric states: Secondary | ICD-10-CM | POA: Diagnosis not present

## 2021-12-09 DIAGNOSIS — Z130 Encounter for screening for diseases of the blood and blood-forming organs and certain disorders involving the immune mechanism: Secondary | ICD-10-CM | POA: Diagnosis not present

## 2021-12-09 DIAGNOSIS — Z6833 Body mass index (BMI) 33.0-33.9, adult: Secondary | ICD-10-CM | POA: Diagnosis not present

## 2021-12-09 DIAGNOSIS — R82998 Other abnormal findings in urine: Secondary | ICD-10-CM | POA: Diagnosis not present

## 2021-12-09 DIAGNOSIS — Z1389 Encounter for screening for other disorder: Secondary | ICD-10-CM | POA: Diagnosis not present

## 2022-02-27 ENCOUNTER — Other Ambulatory Visit: Payer: Self-pay | Admitting: Family Medicine

## 2022-02-27 DIAGNOSIS — I10 Essential (primary) hypertension: Secondary | ICD-10-CM

## 2022-02-28 NOTE — Telephone Encounter (Signed)
Pt needs appointment for further refills 

## 2022-05-23 DIAGNOSIS — R102 Pelvic and perineal pain: Secondary | ICD-10-CM | POA: Diagnosis not present

## 2022-05-24 DIAGNOSIS — R102 Pelvic and perineal pain: Secondary | ICD-10-CM | POA: Diagnosis not present

## 2022-05-26 ENCOUNTER — Other Ambulatory Visit: Payer: Self-pay | Admitting: Family Medicine

## 2022-05-26 DIAGNOSIS — I10 Essential (primary) hypertension: Secondary | ICD-10-CM

## 2022-05-31 ENCOUNTER — Other Ambulatory Visit: Payer: Self-pay | Admitting: Family Medicine

## 2022-05-31 DIAGNOSIS — D509 Iron deficiency anemia, unspecified: Secondary | ICD-10-CM | POA: Diagnosis not present

## 2022-05-31 DIAGNOSIS — R194 Change in bowel habit: Secondary | ICD-10-CM | POA: Diagnosis not present

## 2022-05-31 DIAGNOSIS — K648 Other hemorrhoids: Secondary | ICD-10-CM | POA: Diagnosis not present

## 2022-05-31 DIAGNOSIS — K582 Mixed irritable bowel syndrome: Secondary | ICD-10-CM | POA: Diagnosis not present

## 2022-06-02 DIAGNOSIS — Z1231 Encounter for screening mammogram for malignant neoplasm of breast: Secondary | ICD-10-CM | POA: Diagnosis not present

## 2022-06-22 ENCOUNTER — Other Ambulatory Visit: Payer: Self-pay | Admitting: Family Medicine

## 2022-06-22 DIAGNOSIS — I10 Essential (primary) hypertension: Secondary | ICD-10-CM

## 2022-07-13 ENCOUNTER — Encounter: Payer: Self-pay | Admitting: Family Medicine

## 2022-07-13 DIAGNOSIS — I10 Essential (primary) hypertension: Secondary | ICD-10-CM

## 2022-07-14 MED ORDER — AMLODIPINE BESYLATE 5 MG PO TABS: 5.0000 mg | ORAL_TABLET | Freq: Every day | ORAL | 0 refills | Status: DC

## 2022-07-14 MED ORDER — DIAZEPAM 5 MG PO TABS: 5.0000 mg | ORAL_TABLET | Freq: Two times a day (BID) | ORAL | 0 refills | Status: DC | PRN

## 2022-07-14 NOTE — Telephone Encounter (Signed)
I refilled both for one month

## 2022-08-05 ENCOUNTER — Other Ambulatory Visit: Payer: Self-pay | Admitting: Family Medicine

## 2022-08-05 DIAGNOSIS — I10 Essential (primary) hypertension: Secondary | ICD-10-CM

## 2022-08-05 NOTE — Telephone Encounter (Signed)
Pt needs appointment for further refills 

## 2022-08-24 DIAGNOSIS — L578 Other skin changes due to chronic exposure to nonionizing radiation: Secondary | ICD-10-CM | POA: Diagnosis not present

## 2022-08-24 DIAGNOSIS — L821 Other seborrheic keratosis: Secondary | ICD-10-CM | POA: Diagnosis not present

## 2022-08-24 DIAGNOSIS — D2372 Other benign neoplasm of skin of left lower limb, including hip: Secondary | ICD-10-CM | POA: Diagnosis not present

## 2022-08-24 DIAGNOSIS — L814 Other melanin hyperpigmentation: Secondary | ICD-10-CM | POA: Diagnosis not present

## 2022-08-24 DIAGNOSIS — L02821 Furuncle of head [any part, except face]: Secondary | ICD-10-CM | POA: Diagnosis not present

## 2022-08-24 DIAGNOSIS — L298 Other pruritus: Secondary | ICD-10-CM | POA: Diagnosis not present

## 2022-09-03 ENCOUNTER — Other Ambulatory Visit: Payer: Self-pay | Admitting: Family Medicine

## 2022-09-03 DIAGNOSIS — I10 Essential (primary) hypertension: Secondary | ICD-10-CM

## 2022-09-07 ENCOUNTER — Other Ambulatory Visit: Payer: Self-pay | Admitting: Family Medicine

## 2022-09-07 DIAGNOSIS — I10 Essential (primary) hypertension: Secondary | ICD-10-CM

## 2022-09-19 ENCOUNTER — Encounter: Payer: Self-pay | Admitting: Family Medicine

## 2022-09-19 ENCOUNTER — Ambulatory Visit (INDEPENDENT_AMBULATORY_CARE_PROVIDER_SITE_OTHER): Payer: 59 | Admitting: Family Medicine

## 2022-09-19 VITALS — BP 120/80 | HR 75 | Temp 98.3°F | Wt 196.0 lb

## 2022-09-19 DIAGNOSIS — F4321 Adjustment disorder with depressed mood: Secondary | ICD-10-CM | POA: Diagnosis not present

## 2022-09-19 DIAGNOSIS — G4700 Insomnia, unspecified: Secondary | ICD-10-CM

## 2022-09-19 DIAGNOSIS — F419 Anxiety disorder, unspecified: Secondary | ICD-10-CM

## 2022-09-19 DIAGNOSIS — I10 Essential (primary) hypertension: Secondary | ICD-10-CM

## 2022-09-19 DIAGNOSIS — R69 Illness, unspecified: Secondary | ICD-10-CM | POA: Diagnosis not present

## 2022-09-19 MED ORDER — DIAZEPAM 5 MG PO TABS: 5.0000 mg | ORAL_TABLET | Freq: Two times a day (BID) | ORAL | 5 refills | Status: DC | PRN

## 2022-09-19 MED ORDER — TRAZODONE HCL 50 MG PO TABS: 50.0000 mg | ORAL_TABLET | Freq: Every evening | ORAL | 3 refills | Status: DC | PRN

## 2022-09-19 MED ORDER — AMLODIPINE BESYLATE 5 MG PO TABS: 5.0000 mg | ORAL_TABLET | Freq: Every day | ORAL | 3 refills | Status: DC

## 2022-09-19 NOTE — Progress Notes (Signed)
   Subjective:    Patient ID: Karen Flores, female    DOB: 06/26/1966, 56 y.o.   MRN: [id number]  HPI Here to follow up on several issues. Her BP has been stable. She has felt well except from a psychiatric standpoint. Her anxiety had been doing well until 6 weeks ago. Now during the past 6 weeks her ex-husband (with whom she remained very close) and her mother-in-law have passed away, and this has been very difficult for her. In addition to missing her ex-husband she says they had shared many things including bank accounts, so she is struggling with how to work things out legally and financially. She is sad, tearful, anxious, and she has had trouble sleeping. She had tired Temazepam and Zolpidem in the past, but these gave her side effects. She recently tried a few Trazodone tablets that she borrowed from a friend, and these seemed to help.    Review of Systems  Constitutional: Negative.   Respiratory: Negative.    Cardiovascular: Negative.   Psychiatric/Behavioral:  Positive for decreased concentration, dysphoric mood and sleep disturbance. Negative for agitation and confusion. The patient is nervous/anxious.        Objective:   Physical Exam Constitutional:      Appearance: Normal appearance.  Cardiovascular:     Rate and Rhythm: Normal rate and regular rhythm.     Pulses: Normal pulses.     Heart sounds: Normal heart sounds.  Pulmonary:     Effort: Pulmonary effort is normal.     Breath sounds: Normal breath sounds.  Neurological:     Mental Status: She is alert.  Psychiatric:        Behavior: Behavior normal.        Thought Content: Thought content normal.     Comments: She is tearful            Assessment & Plan:  Her HTN is well controlled, so we refilled the Amlodipine. Her anxiety has been complicated by grieving from her recent losses. We refilled her Diazepam, and I urged her to contact a therapist for help. For sleep, she will try Trazodone 50 mg at bedtime. She  will set up a well exam about a month from now, and we will revisit these issues at that time. We spent a total of (32   ) minutes reviewing records and discussing these issues.  Alysia Penna, MD

## 2022-09-22 ENCOUNTER — Telehealth: Payer: Self-pay | Admitting: Family Medicine

## 2022-09-22 NOTE — Telephone Encounter (Signed)
Phia with [facility]  [phone]  Calling on behalf of Pt.  Pt claims she came in for medication refills and was charged $40, and would like to know why?  As per [employer]:   Benefits $0 co-pay, no deductible, no co-insurance  Rep did not know the date of service. Rep attempted to call Pt to confirm date of service, but could not get her on the line.

## 2022-09-22 NOTE — Telephone Encounter (Signed)
Call agent back spoke with another agent who sated that they dont have an agent by name Phia

## 2022-10-11 ENCOUNTER — Other Ambulatory Visit: Payer: Self-pay | Admitting: Family Medicine

## 2022-10-19 ENCOUNTER — Other Ambulatory Visit: Payer: Self-pay

## 2022-12-27 DIAGNOSIS — Z1389 Encounter for screening for other disorder: Secondary | ICD-10-CM | POA: Diagnosis not present

## 2022-12-27 DIAGNOSIS — Z130 Encounter for screening for diseases of the blood and blood-forming organs and certain disorders involving the immune mechanism: Secondary | ICD-10-CM | POA: Diagnosis not present

## 2022-12-27 DIAGNOSIS — Z1151 Encounter for screening for human papillomavirus (HPV): Secondary | ICD-10-CM | POA: Diagnosis not present

## 2022-12-27 DIAGNOSIS — Z6833 Body mass index (BMI) 33.0-33.9, adult: Secondary | ICD-10-CM | POA: Diagnosis not present

## 2022-12-27 DIAGNOSIS — Z01419 Encounter for gynecological examination (general) (routine) without abnormal findings: Secondary | ICD-10-CM | POA: Diagnosis not present

## 2022-12-27 DIAGNOSIS — N951 Menopausal and female climacteric states: Secondary | ICD-10-CM | POA: Diagnosis not present

## 2022-12-27 DIAGNOSIS — Z124 Encounter for screening for malignant neoplasm of cervix: Secondary | ICD-10-CM | POA: Diagnosis not present

## 2023-01-22 ENCOUNTER — Other Ambulatory Visit: Payer: Self-pay | Admitting: Family Medicine

## 2023-02-17 ENCOUNTER — Other Ambulatory Visit: Payer: Self-pay | Admitting: Family Medicine

## 2023-03-13 ENCOUNTER — Encounter: Payer: Self-pay | Admitting: Family Medicine

## 2023-03-13 ENCOUNTER — Ambulatory Visit (INDEPENDENT_AMBULATORY_CARE_PROVIDER_SITE_OTHER): Payer: 59 | Admitting: Family Medicine

## 2023-03-13 VITALS — BP 132/88 | HR 94 | Temp 98.1°F

## 2023-03-13 DIAGNOSIS — F4322 Adjustment disorder with anxiety: Secondary | ICD-10-CM

## 2023-03-13 DIAGNOSIS — M6740 Ganglion, unspecified site: Secondary | ICD-10-CM | POA: Diagnosis not present

## 2023-03-13 NOTE — Addendum Note (Signed)
Addended by: Deeann Saint on: 03/13/2023 05:29 PM   Modules accepted: Level of Service

## 2023-03-13 NOTE — Progress Notes (Signed)
Established Patient Office Visit   Subjective  Patient ID: Karen Flores, female    DOB: 03/03/1966  Age: 57 y.o. MRN: [id number]  Chief Complaint  Patient presents with   Cyst    Left thumb, has knot that is painful. Noticed it about 2 ws ago. Has just taken ibuprofen.     Patient is a 57 year old female followed by Dr. Clent Ridges and seen for acute concern.  Patient endorses a painful bump in left thumb x 2 weeks.  Denies injury.  No pain with bending thumb, but can tell the bump is there.  Patient recounts tearful.  Endorses increased anxiety is under a lot of stress and change.  Patient dealing with the death of her ex-husband who was her best friend.  Patient states they were great coparents.  Inquires about medication options.  Has prescription for diazepam but does not take often.  Interested in Lexapro as a friend is on the medication.  Patient states she does not have time for counseling and is concerned about the cost as she recently started working again.      ROS Negative unless stated above    Objective:     BP 132/88 (BP Location: Left Arm, Patient Position: Sitting, Cuff Size: Normal)   Pulse 94   Temp 98.1 F (36.7 C) (Oral)   LMP 11/16/2011   SpO2 96%    Physical Exam Constitutional:      General: She is not in acute distress.    Appearance: Normal appearance.     Comments: Tearful  HENT:     Head: Normocephalic and atraumatic.     Nose: Nose normal.     Mouth/Throat:     Mouth: Mucous membranes are moist.  Cardiovascular:     Rate and Rhythm: Normal rate and regular rhythm.     Heart sounds: Normal heart sounds. No murmur heard.    No gallop.  Pulmonary:     Effort: Pulmonary effort is normal. No respiratory distress.     Breath sounds: Normal breath sounds. No wheezing, rhonchi or rales.  Skin:    General: Skin is warm and dry.     Comments: Mobile 5 mm slightly fluctuant cystic lesion in medial L thumb, mildly tender  Neurological:     Mental  Status: She is alert and oriented to person, place, and time.       03/13/2023    5:05 PM 09/19/2022    4:06 PM  Depression screen PHQ 2/9  Decreased Interest 1 1  Down, Depressed, Hopeless 2 1  PHQ - 2 Score 3 2  Altered sleeping 1 2  Tired, decreased energy 2 2  Change in appetite 0 1  Feeling bad or failure about yourself  2 3  Trouble concentrating 2 2  Moving slowly or fidgety/restless 0 0  Suicidal thoughts 0   PHQ-9 Score 10 12  Difficult doing work/chores Somewhat difficult Somewhat difficult      03/13/2023    5:06 PM  GAD 7 : Generalized Anxiety Score  Nervous, Anxious, on Edge 2  Control/stop worrying 2  Worry too much - different things 2  Trouble relaxing 2  Restless 1  Easily annoyed or irritable 3  Afraid - awful might happen 2  Total GAD 7 Score 14  Anxiety Difficulty Somewhat difficult      No results found for any visits on 03/13/23.    Assessment & Plan:  Ganglion cyst -New problem -Discussed removal options.   -Patient  wishes to wait for referral at this time.    Adjustment disorder with anxious mood -Increasing per patient -Due to grief -PHQ-9 score 10 -GAD-7 score 14 this visit -Discussed counseling options clued an EAP program with work however patient declines at this time -Will have patient set up follow-up appointment with PCP for labs and medication changes -Given strict precautions  Return in about 3 weeks (around 04/03/2023).   Deeann Saint, MD

## 2023-05-08 ENCOUNTER — Telehealth: Payer: Self-pay | Admitting: Family Medicine

## 2023-05-08 NOTE — Telephone Encounter (Signed)
Pt is calling and last seen in nov 2023 and pt said she suppose to have order for blood work. I do not see order. Please advise

## 2023-05-08 NOTE — Telephone Encounter (Signed)
Left detailed message advised to call the office regarding this message

## 2023-05-09 NOTE — Telephone Encounter (Signed)
Pt called, returning CMA's call. CMA was with a patient. Pt asked that CMA call back at her earliest convenience. 

## 2023-05-09 NOTE — Telephone Encounter (Signed)
Spoke with pt schedule OV with Dr Clent Ridges for 05/11/23

## 2023-05-11 ENCOUNTER — Other Ambulatory Visit (INDEPENDENT_AMBULATORY_CARE_PROVIDER_SITE_OTHER): Payer: 59

## 2023-05-11 ENCOUNTER — Other Ambulatory Visit: Payer: Self-pay

## 2023-05-11 ENCOUNTER — Ambulatory Visit (INDEPENDENT_AMBULATORY_CARE_PROVIDER_SITE_OTHER): Payer: 59 | Admitting: Family Medicine

## 2023-05-11 ENCOUNTER — Encounter: Payer: Self-pay | Admitting: Family Medicine

## 2023-05-11 VITALS — BP 118/80 | HR 79 | Temp 98.3°F | Wt 208.0 lb

## 2023-05-11 DIAGNOSIS — E538 Deficiency of other specified B group vitamins: Secondary | ICD-10-CM

## 2023-05-11 DIAGNOSIS — D509 Iron deficiency anemia, unspecified: Secondary | ICD-10-CM

## 2023-05-11 DIAGNOSIS — F419 Anxiety disorder, unspecified: Secondary | ICD-10-CM | POA: Diagnosis not present

## 2023-05-11 DIAGNOSIS — E162 Hypoglycemia, unspecified: Secondary | ICD-10-CM

## 2023-05-11 LAB — VITAMIN B12: Vitamin B-12: 179 pg/mL — ABNORMAL LOW (ref 211–911)

## 2023-05-11 LAB — BASIC METABOLIC PANEL
BUN: 19 mg/dL (ref 6–23)
CO2: 27 mEq/L (ref 19–32)
Calcium: 9.5 mg/dL (ref 8.4–10.5)
Chloride: 107 mEq/L (ref 96–112)
Creatinine, Ser: 0.99 mg/dL (ref 0.40–1.20)
GFR: 63.95 mL/min (ref >60.00)
Glucose, Bld: 106 mg/dL — ABNORMAL HIGH (ref 70–99)
Potassium: 4.3 mEq/L (ref 3.5–5.1)
Sodium: 141 mEq/L (ref 135–145)

## 2023-05-11 LAB — CBC WITH DIFFERENTIAL/PLATELET
Basophils Absolute: 0 10*3/uL (ref 0.0–0.1)
Basophils Relative: 0.3 % (ref 0.0–3.0)
Eosinophils Absolute: 0.1 10*3/uL (ref 0.0–0.7)
Eosinophils Relative: 2 % (ref 0.0–5.0)
HCT: 33.9 % — ABNORMAL LOW (ref 36.0–46.0)
Hemoglobin: 10.9 g/dL — ABNORMAL LOW (ref 12.0–15.0)
Lymphocytes Relative: 30.1 % (ref 12.0–46.0)
Lymphs Abs: 1.7 10*3/uL (ref 0.7–4.0)
MCHC: 32.3 g/dL (ref 30.0–36.0)
MCV: 76.8 fl — ABNORMAL LOW (ref 78.0–100.0)
Monocytes Absolute: 0.5 10*3/uL (ref 0.1–1.0)
Monocytes Relative: 8.1 % (ref 3.0–12.0)
Neutro Abs: 3.4 10*3/uL (ref 1.4–7.7)
Neutrophils Relative %: 59.5 % (ref 43.0–77.0)
Platelets: 286 10*3/uL (ref 150.0–400.0)
RBC: 4.41 Mil/uL (ref 3.87–5.11)
RDW: 15 % (ref 11.5–15.5)
WBC: 5.7 10*3/uL (ref 4.0–10.5)

## 2023-05-11 LAB — LIPID PANEL
Cholesterol: 198 mg/dL (ref 0–200)
HDL: 44.9 mg/dL (ref >39.00)
LDL Cholesterol: 129 mg/dL — ABNORMAL HIGH (ref 0–99)
NonHDL: 153.45
Total CHOL/HDL Ratio: 4
Triglycerides: 123 mg/dL (ref 0.0–149.0)
VLDL: 24.6 mg/dL (ref 0.0–40.0)

## 2023-05-11 LAB — HEPATIC FUNCTION PANEL
ALT: 16 U/L (ref 0–35)
AST: 15 U/L (ref 0–37)
Albumin: 4.5 g/dL (ref 3.5–5.2)
Alkaline Phosphatase: 78 U/L (ref 39–117)
Bilirubin, Direct: 0.1 mg/dL (ref 0.0–0.3)
Total Bilirubin: 0.5 mg/dL (ref 0.2–1.2)
Total Protein: 7 g/dL (ref 6.0–8.3)

## 2023-05-11 LAB — POCT GLYCOSYLATED HEMOGLOBIN (HGB A1C): Hemoglobin A1C: 5.6 % (ref 4.0–5.6)

## 2023-05-11 LAB — GLUCOSE, POCT (MANUAL RESULT ENTRY): POC Glucose: 116 mg/dl — AB (ref 70–99)

## 2023-05-11 LAB — TSH: TSH: 1.22 u[IU]/mL (ref 0.35–5.50)

## 2023-05-11 MED ORDER — ESCITALOPRAM OXALATE 10 MG PO TABS: 10.0000 mg | ORAL_TABLET | Freq: Every day | ORAL | 2 refills | Status: DC

## 2023-05-11 MED ORDER — FERROUS SULFATE 325 (65 FE) MG PO TABS: 325.0000 mg | ORAL_TABLET | Freq: Two times a day (BID) | ORAL | 3 refills | Status: DC

## 2023-05-11 NOTE — Progress Notes (Signed)
   Subjective:    Patient ID: Karen Flores, female    DOB: [birth year], 57 y.o.   MRN: [id number]  HPI Here for several issues. First she thinks her blood glucose drops sometimes. Starting about a month ago, she has had 5 or 6 episodes where she starts to feel weak and shaky, she feels like she may pass out, and she feels tightness in her head and throat. She had one of these several days ago that was more severe, and it alarmed her she normally does not eat breakfast, but she will have 2 cups of coffee with creamer. These episodes only occur the late morning hours. Each she feel better immediately when she eats some candy or drinks a soda. She had no glucose issues during her pregnancies. The other concern is her anxiety. She has been taking Valium twice a day, but her anxiety has gotten much worse. She worries about her children and about events on a national and world wide level. She worries about her children's futures. She sleeps fairly well using Trazodone.    Review of Systems  Constitutional: Negative.   Respiratory: Negative.    Cardiovascular: Negative.   Gastrointestinal: Negative.   Genitourinary: Negative.   Neurological: Negative.   Psychiatric/Behavioral:  Negative for agitation, behavioral problems, confusion, decreased concentration, dysphoric mood and sleep disturbance. The patient is nervous/anxious.        Objective:   Physical Exam Constitutional:      Appearance: Normal appearance.  Cardiovascular:     Rate and Rhythm: Normal rate and regular rhythm.     Pulses: Normal pulses.     Heart sounds: Normal heart sounds.  Pulmonary:     Effort: Pulmonary effort is normal.     Breath sounds: Normal breath sounds.  Neurological:     General: No focal deficit present.     Mental Status: She is alert and oriented to person, place, and time.  Psychiatric:        Behavior: Behavior normal.        Thought Content: Thought content normal.     Comments: She is anxious and  tearful            Assessment & Plan:  It definitely sounds like she is having hypoglycemic spells. We will send her for full lab workup, though her A1c is normal at 5.6%. we discussed the importance of eating regular meals, especially breakfast, and of avoiding high carb meals. We will refer her to Nutrition. Also her anxiety is worse, so we will start her on Lexapro 10 mg daily. She will follow up with [place] in 3 weeks. I also encouraged her to talk with a therapist.  Gershon Crane, MD

## 2023-05-11 NOTE — Addendum Note (Signed)
Addended by: Gershon Crane A on: 05/11/2023 12:14 PM   Modules accepted: Orders

## 2023-05-11 NOTE — Addendum Note (Signed)
Addended by: Carola Rhine on: 05/11/2023 10:45 AM   Modules accepted: Orders

## 2023-05-18 ENCOUNTER — Ambulatory Visit (INDEPENDENT_AMBULATORY_CARE_PROVIDER_SITE_OTHER): Payer: 59

## 2023-05-18 DIAGNOSIS — E538 Deficiency of other specified B group vitamins: Secondary | ICD-10-CM

## 2023-05-18 MED ORDER — CYANOCOBALAMIN 1000 MCG/ML IJ SOLN
1000.0000 ug | Freq: Once | INTRAMUSCULAR | Status: AC
Start: 2023-05-18 — End: 2023-05-18
  Administered 2023-05-18: 1000 ug via INTRAMUSCULAR

## 2023-05-18 NOTE — Progress Notes (Signed)
Per orders of Shirline Frees, NP, injection of cyanocobalamin 1000 mcg given by Vickii Chafe on Right Deltoid.  Patient tolerated injection well.

## 2023-05-25 ENCOUNTER — Telehealth: Payer: Self-pay | Admitting: *Deleted

## 2023-05-25 ENCOUNTER — Ambulatory Visit (INDEPENDENT_AMBULATORY_CARE_PROVIDER_SITE_OTHER): Payer: 59 | Admitting: *Deleted

## 2023-05-25 DIAGNOSIS — E538 Deficiency of other specified B group vitamins: Secondary | ICD-10-CM | POA: Diagnosis not present

## 2023-05-25 MED ORDER — CYANOCOBALAMIN 1000 MCG/ML IJ SOLN
1000.0000 ug | Freq: Once | INTRAMUSCULAR | Status: AC
Start: 2023-05-25 — End: 2023-05-25
  Administered 2023-05-25: 1000 ug via INTRAMUSCULAR

## 2023-05-25 NOTE — Telephone Encounter (Signed)
Patient here for her weekly B12 injection.  Patient is requesting a prescription for B12 and equipment so that she can give herself B12 injections at home. [facility]. Okay to send?

## 2023-05-25 NOTE — Progress Notes (Signed)
Per orders of Cory Nafziger NP, injection of B12 given by Rachel Vereen. Patient tolerated injection well. 

## 2023-05-26 NOTE — Telephone Encounter (Signed)
Yes please do this

## 2023-05-29 MED ORDER — "BD SAFETYGLIDE SYRINGE/NEEDLE 25G X 1"" 3 ML MISC": 3 refills | Status: DC

## 2023-05-29 MED ORDER — CYANOCOBALAMIN 1000 MCG/ML IJ SOLN: INTRAMUSCULAR | 11 refills | Status: AC

## 2023-06-02 ENCOUNTER — Other Ambulatory Visit: Payer: Self-pay | Admitting: Family Medicine

## 2023-06-08 DIAGNOSIS — Z1231 Encounter for screening mammogram for malignant neoplasm of breast: Secondary | ICD-10-CM | POA: Diagnosis not present

## 2023-06-09 ENCOUNTER — Telehealth (INDEPENDENT_AMBULATORY_CARE_PROVIDER_SITE_OTHER): Payer: 59 | Admitting: Family Medicine

## 2023-06-09 ENCOUNTER — Encounter: Payer: Self-pay | Admitting: Family Medicine

## 2023-06-09 DIAGNOSIS — E538 Deficiency of other specified B group vitamins: Secondary | ICD-10-CM | POA: Diagnosis not present

## 2023-06-09 DIAGNOSIS — F419 Anxiety disorder, unspecified: Secondary | ICD-10-CM | POA: Diagnosis not present

## 2023-06-09 DIAGNOSIS — E162 Hypoglycemia, unspecified: Secondary | ICD-10-CM | POA: Diagnosis not present

## 2023-06-09 DIAGNOSIS — D649 Anemia, unspecified: Secondary | ICD-10-CM | POA: Insufficient documentation

## 2023-06-09 NOTE — Progress Notes (Signed)
Subjective:    Patient ID: Karen Flores, female    DOB: 01/04/1966, 57 y.o.   MRN: [id number]  HPI Virtual Visit via Video Note  I connected with the patient on 06/09/23 at  1:00 PM EDT by a video enabled telemedicine application and verified that I am speaking with the correct person using two identifiers.  Location patient: home Location provider:work or home office Persons participating in the virtual visit: patient, provider  I discussed the limitations of evaluation and management by telemedicine and the availability of in person appointments. The patient expressed understanding and agreed to proceed.   HPI: Here to follow up on possible hypoglycemic spells, anxiety, and anemia. At our last visit she started on Lexapro 10 mg daily, and she says this has been quite helpful. She feels less anxious and in better control of her emotions. She has been taking weekly B12 shots and daily iron pills for the anemia. She has been more careful to avoid skipping meals (especially breakfast), and she has not had any more spells of weakness and shakiness like before.    ROS: See pertinent positives and negatives per HPI.  Past Medical History:  Diagnosis Date   Anxiety    PONV (postoperative nausea and vomiting)    Scoliosis     Past Surgical History:  Procedure Laterality Date   CERVICAL DISC SURGERY  2015   colonscopy     HAND SURGERY  as a child and in 11/2009    x 2, Left wrist surgery with plates and screws   LAPAROSCOPIC SUPRACERVICAL HYSTERECTOMY  11/29/2011   Procedure: LAPAROSCOPIC SUPRACERVICAL HYSTERECTOMY;  Surgeon: Zenaida Niece, MD;  Location: [facility];  Service: Gynecology;  Laterality: N/A;   SPINE SURGERY     SVD     x 2   WISDOM TOOTH EXTRACTION      Family History  Problem Relation Age of Onset   Hypertension Mother    Stroke Mother    Alzheimer's disease Father      Current Outpatient Medications:    amLODipine (NORVASC) 5 MG tablet, Take 1 tablet (5 mg  total) by mouth daily., Disp: 90 tablet, Rfl: 3   cyanocobalamin (VITAMIN B12) 1000 MCG/ML injection, Inject 1 ml into the muscle once a week for 12 weeks, Disp: 6 mL, Rfl: 11   diazepam (VALIUM) 5 MG tablet, Take 1 tablet (5 mg total) by mouth every 12 (twelve) hours as needed for anxiety., Disp: 60 tablet, Rfl: 5   escitalopram (LEXAPRO) 10 MG tablet, TAKE 1 TABLET BY MOUTH EVERY DAY, Disp: 90 tablet, Rfl: 1   ferrous sulfate 325 (65 FE) MG tablet, Take 1 tablet (325 mg total) by mouth 2 (two) times daily with a meal., Disp: 90 tablet, Rfl: 3   SYRINGE-NEEDLE, DISP, 3 ML (BD SAFETYGLIDE SYRINGE/NEEDLE) 25G X 1" 3 ML MISC, Use for B12 injections, Disp: 100 each, Rfl: 3   traZODone (DESYREL) 50 MG tablet, TAKE 1 TABLET BY MOUTH EVERY DAY AT BEDTIME AS NEEDED FOR SLEEP, Disp: 90 tablet, Rfl: 0   acetaminophen (TYLENOL) 500 MG tablet, Take 500 mg by mouth every 6 (six) hours as needed for mild pain. (Patient not taking: Reported on 05/11/2023), Disp: , Rfl:   EXAM:  VITALS per patient if applicable:  GENERAL: alert, oriented, appears well and in no acute distress  HEENT: atraumatic, conjunttiva clear, no obvious abnormalities on inspection of external nose and ears  NECK: normal movements of the head and neck  LUNGS:  on inspection no signs of respiratory distress, breathing rate appears normal, no obvious gross SOB, gasping or wheezing  CV: no obvious cyanosis  MS: moves all visible extremities without noticeable abnormality  PSYCH/NEURO: pleasant and cooperative, no obvious depression or anxiety, speech and thought processing grossly intact  ASSESSMENT AND PLAN: Her hypoglycemia is controlled with diet. She will continue on B12 shots and iron pills, and we will recheck levels in 2 months. Her anxiety is better controlled, and she decided to stay on the 10 mg dose for now.  Gershon Crane, MD  Discussed the following assessment and plan:  No diagnosis found.     I discussed the  assessment and treatment plan with the patient. The patient was provided an opportunity to ask questions and all were answered. The patient agreed with the plan and demonstrated an understanding of the instructions.   The patient was advised to call back or seek an in-person evaluation if the symptoms worsen or if the condition fails to improve as anticipated.      Review of Systems     Objective:   Physical Exam        Assessment & Plan:

## 2023-06-15 DIAGNOSIS — R922 Inconclusive mammogram: Secondary | ICD-10-CM | POA: Diagnosis not present

## 2023-06-15 DIAGNOSIS — N63 Unspecified lump in unspecified breast: Secondary | ICD-10-CM | POA: Diagnosis not present

## 2023-06-15 DIAGNOSIS — N6002 Solitary cyst of left breast: Secondary | ICD-10-CM | POA: Diagnosis not present

## 2023-07-31 ENCOUNTER — Ambulatory Visit: Payer: 59 | Admitting: Dietician

## 2023-08-14 ENCOUNTER — Other Ambulatory Visit (INDEPENDENT_AMBULATORY_CARE_PROVIDER_SITE_OTHER): Payer: 59

## 2023-08-14 DIAGNOSIS — E538 Deficiency of other specified B group vitamins: Secondary | ICD-10-CM

## 2023-08-14 DIAGNOSIS — D509 Iron deficiency anemia, unspecified: Secondary | ICD-10-CM

## 2023-08-15 LAB — CBC WITH DIFFERENTIAL/PLATELET
Basophils Absolute: 0.1 10*3/uL (ref 0.0–0.1)
Basophils Relative: 1 % (ref 0.0–3.0)
Eosinophils Absolute: 0.1 10*3/uL (ref 0.0–0.7)
Eosinophils Relative: 1.4 % (ref 0.0–5.0)
HCT: 39.1 % (ref 36.0–46.0)
Hemoglobin: 12.7 g/dL (ref 12.0–15.0)
Lymphocytes Relative: 26.4 % (ref 12.0–46.0)
Lymphs Abs: 2.2 10*3/uL (ref 0.7–4.0)
MCHC: 32.4 g/dL (ref 30.0–36.0)
MCV: 89.3 fL (ref 78.0–100.0)
Monocytes Absolute: 0.7 10*3/uL (ref 0.1–1.0)
Monocytes Relative: 9 % (ref 3.0–12.0)
Neutro Abs: 5.2 10*3/uL (ref 1.4–7.7)
Neutrophils Relative %: 62.2 % (ref 43.0–77.0)
Platelets: 258 10*3/uL (ref 150.0–400.0)
RBC: 4.38 Mil/uL (ref 3.87–5.11)
RDW: 15.7 % — ABNORMAL HIGH (ref 11.5–15.5)
WBC: 8.3 10*3/uL (ref 4.0–10.5)

## 2023-08-15 LAB — IBC + FERRITIN
Ferritin: 12.2 ng/mL (ref 10.0–291.0)
Iron: 56 ug/dL (ref 42–145)
Saturation Ratios: 12.2 % — ABNORMAL LOW (ref 20.0–50.0)
TIBC: 457.8 ug/dL — ABNORMAL HIGH (ref 250.0–450.0)
Transferrin: 327 mg/dL (ref 212.0–360.0)

## 2023-08-15 LAB — VITAMIN B12: Vitamin B-12: 523 pg/mL (ref 211–911)

## 2023-08-30 DIAGNOSIS — L821 Other seborrheic keratosis: Secondary | ICD-10-CM | POA: Diagnosis not present

## 2023-08-30 DIAGNOSIS — D225 Melanocytic nevi of trunk: Secondary | ICD-10-CM | POA: Diagnosis not present

## 2023-08-30 DIAGNOSIS — L578 Other skin changes due to chronic exposure to nonionizing radiation: Secondary | ICD-10-CM | POA: Diagnosis not present

## 2023-08-30 DIAGNOSIS — L218 Other seborrheic dermatitis: Secondary | ICD-10-CM | POA: Diagnosis not present

## 2023-08-30 DIAGNOSIS — L814 Other melanin hyperpigmentation: Secondary | ICD-10-CM | POA: Diagnosis not present

## 2023-09-08 ENCOUNTER — Ambulatory Visit: Payer: 59 | Admitting: Dietician

## 2023-09-16 ENCOUNTER — Other Ambulatory Visit: Payer: Self-pay | Admitting: Family Medicine

## 2023-09-16 DIAGNOSIS — I10 Essential (primary) hypertension: Secondary | ICD-10-CM

## 2023-11-15 ENCOUNTER — Other Ambulatory Visit: Payer: Self-pay | Admitting: Family Medicine

## 2023-12-13 ENCOUNTER — Other Ambulatory Visit: Payer: Self-pay | Admitting: Family Medicine

## 2023-12-18 ENCOUNTER — Other Ambulatory Visit: Payer: Self-pay | Admitting: Family Medicine

## 2023-12-18 NOTE — Telephone Encounter (Signed)
 Copied from CRM [id number]. Topic: Clinical - Medication Refill >> Dec 18, 2023  8:40 AM Lennart Pall wrote: Most Recent Primary Care Visit:  Provider: [facility]  Department: [facility]  Visit Type: LAB  Date: 08/14/2023  Medication: escitalopram (LEXAPRO) 10 MG tablet  Has the patient contacted their pharmacy? Yes (Agent: If no, request that the patient contact the pharmacy for the refill. If patient does not wish to contact the pharmacy document the reason why and proceed with request.) (Agent: If yes, when and what did the pharmacy advise?)  Is this the correct pharmacy for this prescription? Yes If no, delete pharmacy and type the correct one.  This is the patient's preferred pharmacy:   [id number] [place] [place] [facility] [address] [place] [address] Phone: [phone] Fax: [phone]    Has the prescription been filled recently? Yes  Is the patient out of the medication? Yes  Has the patient been seen for an appointment in the last year OR does the patient have an upcoming appointment? Yes  Can we respond through MyChart? Yes  Agent: Please be advised that Rx refills may take up to 3 business days. We ask that you follow-up with your pharmacy.

## 2023-12-18 NOTE — Telephone Encounter (Signed)
 Last filled 11/16/23, 30 tabs/5 refills, receipt confirmed by pharmacy, no further action needed.

## 2024-01-02 DIAGNOSIS — R82998 Other abnormal findings in urine: Secondary | ICD-10-CM | POA: Diagnosis not present

## 2024-01-02 DIAGNOSIS — Z01419 Encounter for gynecological examination (general) (routine) without abnormal findings: Secondary | ICD-10-CM | POA: Diagnosis not present

## 2024-01-02 DIAGNOSIS — Z1389 Encounter for screening for other disorder: Secondary | ICD-10-CM | POA: Diagnosis not present

## 2024-01-02 DIAGNOSIS — Z6835 Body mass index (BMI) 35.0-35.9, adult: Secondary | ICD-10-CM | POA: Diagnosis not present

## 2024-03-10 ENCOUNTER — Other Ambulatory Visit: Payer: Self-pay | Admitting: Family Medicine

## 2024-05-24 DIAGNOSIS — H53143 Visual discomfort, bilateral: Secondary | ICD-10-CM | POA: Diagnosis not present

## 2024-05-24 DIAGNOSIS — H2513 Age-related nuclear cataract, bilateral: Secondary | ICD-10-CM | POA: Diagnosis not present

## 2024-06-11 ENCOUNTER — Other Ambulatory Visit: Payer: Self-pay | Admitting: Family Medicine

## 2024-06-13 DIAGNOSIS — Z1231 Encounter for screening mammogram for malignant neoplasm of breast: Secondary | ICD-10-CM | POA: Diagnosis not present

## 2024-07-02 DIAGNOSIS — R1319 Other dysphagia: Secondary | ICD-10-CM | POA: Diagnosis not present

## 2024-07-02 DIAGNOSIS — D500 Iron deficiency anemia secondary to blood loss (chronic): Secondary | ICD-10-CM | POA: Diagnosis not present

## 2024-08-29 DIAGNOSIS — L578 Other skin changes due to chronic exposure to nonionizing radiation: Secondary | ICD-10-CM | POA: Diagnosis not present

## 2024-08-29 DIAGNOSIS — L821 Other seborrheic keratosis: Secondary | ICD-10-CM | POA: Diagnosis not present

## 2024-08-29 DIAGNOSIS — L814 Other melanin hyperpigmentation: Secondary | ICD-10-CM | POA: Diagnosis not present

## 2024-08-29 DIAGNOSIS — D225 Melanocytic nevi of trunk: Secondary | ICD-10-CM | POA: Diagnosis not present

## 2024-09-07 ENCOUNTER — Other Ambulatory Visit: Payer: Self-pay | Admitting: Family Medicine

## 2024-09-11 ENCOUNTER — Other Ambulatory Visit: Payer: Self-pay | Admitting: Family Medicine

## 2024-09-11 DIAGNOSIS — I10 Essential (primary) hypertension: Secondary | ICD-10-CM

## 2024-10-08 ENCOUNTER — Other Ambulatory Visit: Payer: Self-pay | Admitting: Family Medicine

## 2024-10-08 DIAGNOSIS — I10 Essential (primary) hypertension: Secondary | ICD-10-CM

## 2024-10-12 ENCOUNTER — Other Ambulatory Visit: Payer: Self-pay | Admitting: Family Medicine

## 2024-10-12 DIAGNOSIS — I10 Essential (primary) hypertension: Secondary | ICD-10-CM

## 2024-10-22 ENCOUNTER — Encounter: Payer: Self-pay | Admitting: Family Medicine

## 2024-10-22 ENCOUNTER — Ambulatory Visit (INDEPENDENT_AMBULATORY_CARE_PROVIDER_SITE_OTHER): Admitting: Family Medicine

## 2024-10-22 VITALS — BP 126/80 | HR 71 | Temp 98.4°F | Ht 64.0 in | Wt 207.0 lb

## 2024-10-22 DIAGNOSIS — E785 Hyperlipidemia, unspecified: Secondary | ICD-10-CM

## 2024-10-22 DIAGNOSIS — D509 Iron deficiency anemia, unspecified: Secondary | ICD-10-CM

## 2024-10-22 DIAGNOSIS — I10 Essential (primary) hypertension: Secondary | ICD-10-CM | POA: Diagnosis not present

## 2024-10-22 DIAGNOSIS — G4700 Insomnia, unspecified: Secondary | ICD-10-CM | POA: Diagnosis not present

## 2024-10-22 DIAGNOSIS — E538 Deficiency of other specified B group vitamins: Secondary | ICD-10-CM | POA: Diagnosis not present

## 2024-10-22 DIAGNOSIS — F419 Anxiety disorder, unspecified: Secondary | ICD-10-CM

## 2024-10-22 DIAGNOSIS — R739 Hyperglycemia, unspecified: Secondary | ICD-10-CM | POA: Diagnosis not present

## 2024-10-22 DIAGNOSIS — K219 Gastro-esophageal reflux disease without esophagitis: Secondary | ICD-10-CM

## 2024-10-22 LAB — LIPID PANEL
Cholesterol: 225 mg/dL — ABNORMAL HIGH (ref 28–200)
HDL: 41.6 mg/dL
LDL Cholesterol: 136 mg/dL — ABNORMAL HIGH (ref 10–99)
NonHDL: 183.77
Total CHOL/HDL Ratio: 5
Triglycerides: 237 mg/dL — ABNORMAL HIGH (ref 10.0–149.0)
VLDL: 47.4 mg/dL — ABNORMAL HIGH (ref 0.0–40.0)

## 2024-10-22 LAB — TSH: TSH: 1.13 u[IU]/mL (ref 0.35–5.50)

## 2024-10-22 LAB — HEMOGLOBIN A1C: Hgb A1c MFr Bld: 5.9 % (ref 4.6–6.5)

## 2024-10-22 MED ORDER — FAMOTIDINE 40 MG PO TABS: 40.0000 mg | ORAL_TABLET | Freq: Every day | ORAL | 3 refills | Status: AC

## 2024-10-22 MED ORDER — AMLODIPINE BESYLATE 5 MG PO TABS: 5.0000 mg | ORAL_TABLET | Freq: Every day | ORAL | 3 refills | Status: AC

## 2024-10-22 MED ORDER — "BD SAFETYGLIDE SYRINGE/NEEDLE 25G X 1"" 3 ML MISC": 3 refills | Status: AC

## 2024-10-22 MED ORDER — FERROUS SULFATE 325 (65 FE) MG PO TABS: 325.0000 mg | ORAL_TABLET | Freq: Every day | ORAL | Status: AC

## 2024-10-22 NOTE — Progress Notes (Signed)
" ° °  Subjective:    Patient ID: Karen Flores, female    DOB: April 05, 1966, 58 y.o.   MRN: [id number]  HPI Here to follow up on issue. She feels well in general except for some chronic fatigue. No doubt this is related to her iron deficiency anemia. She has been seeing Charmaine Barrack NP at [facility] for this, and they have been focusing on oral iron replacement. She is currently taking one iron pill daily because she cannot tolerate more than that (she gets nausea and constipation with any more). Her last labs on 07-02-24 showed a very low iron at 16, ferritin at 4, and Hgb at 7.4. Her B12 was normal. Her renal function has been normal. Her BP has been stable. She has felt much better emotionally, and she is sleeping well. This has enabled her to stop taking Lexapro , Valium , and Trazodone . She sees Dr. Horacio for GYN care.    Review of Systems  Constitutional:  Positive for fatigue.  Respiratory: Negative.    Cardiovascular: Negative.   Gastrointestinal: Negative.   Genitourinary: Negative.   Neurological: Negative.   Psychiatric/Behavioral: Negative.         Objective:    Physical Exam Constitutional:      Appearance: She is obese.  Cardiovascular:     Rate and Rhythm: Normal rate and regular rhythm.     Pulses: Normal pulses.     Heart sounds: Normal heart sounds.  Pulmonary:     Effort: Pulmonary effort is normal.     Breath sounds: Normal breath sounds.  Musculoskeletal:     Right lower leg: No edema.     Left lower leg: No edema.  Neurological:     General: No focal deficit present.     Mental Status: She is alert and oriented to person, place, and time.  Psychiatric:        Mood and Affect: Mood normal.        Behavior: Behavior normal.        Thought Content: Thought content normal.           Assessment & Plan:  Her HTN is stable. Her anxiety and insomnia are well controlled off medications. Her GERD and B12 deficiency are stable. Her iron deficiency anemia however  is not well controlled. I think she would benefit from an iron infusion, so we will refer her to Hematology to evaluate. I personally spent a total of 34 minutes in the care of the patient today including getting/reviewing separately obtained history, performing a medically appropriate exam/evaluation, placing orders, and referring and communicating with other health care professionals.  Garnette Olmsted, MD   "

## 2024-10-23 ENCOUNTER — Ambulatory Visit: Payer: Self-pay | Admitting: Family Medicine

## 2024-10-30 DIAGNOSIS — E785 Hyperlipidemia, unspecified: Secondary | ICD-10-CM | POA: Insufficient documentation

## 2024-10-30 MED ORDER — ATORVASTATIN CALCIUM 10 MG PO TABS: 10.0000 mg | ORAL_TABLET | Freq: Every day | ORAL | 3 refills | Status: AC

## 2024-10-30 NOTE — Telephone Encounter (Signed)
 Yes getting on a medication would would be a good idea. I have sent in a RX to start on Lipitor 10 mg daily. Let's check the lipids again in 90 days

## 2024-10-30 NOTE — Addendum Note (Signed)
 Addended by: JOHNNY SENIOR A on: 10/30/2024 07:53 AM   Modules accepted: Orders

## 2024-11-11 ENCOUNTER — Other Ambulatory Visit: Payer: Self-pay | Admitting: Family

## 2024-11-11 DIAGNOSIS — D649 Anemia, unspecified: Secondary | ICD-10-CM

## 2024-11-12 ENCOUNTER — Encounter: Payer: Self-pay | Admitting: Family

## 2024-11-12 ENCOUNTER — Inpatient Hospital Stay: Payer: Self-pay | Attending: Hematology & Oncology

## 2024-11-12 ENCOUNTER — Inpatient Hospital Stay: Payer: Self-pay | Admitting: Family

## 2024-11-12 VITALS — BP 141/92 | HR 82 | Temp 97.9°F | Resp 19 | Ht 64.0 in | Wt 207.8 lb

## 2024-11-12 DIAGNOSIS — D509 Iron deficiency anemia, unspecified: Secondary | ICD-10-CM | POA: Diagnosis not present

## 2024-11-12 DIAGNOSIS — D649 Anemia, unspecified: Secondary | ICD-10-CM

## 2024-11-12 LAB — CBC WITH DIFFERENTIAL (CANCER CENTER ONLY)
Abs Immature Granulocytes: 0.05 K/uL (ref 0.00–0.07)
Basophils Absolute: 0 K/uL (ref 0.0–0.1)
Basophils Relative: 0 %
Eosinophils Absolute: 0.2 K/uL (ref 0.0–0.5)
Eosinophils Relative: 2 %
HCT: 36.5 % (ref 36.0–46.0)
Hemoglobin: 11.9 g/dL — ABNORMAL LOW (ref 12.0–15.0)
Immature Granulocytes: 1 %
Lymphocytes Relative: 31 %
Lymphs Abs: 2.2 K/uL (ref 0.7–4.0)
MCH: 28.3 pg (ref 26.0–34.0)
MCHC: 32.6 g/dL (ref 30.0–36.0)
MCV: 86.7 fL (ref 80.0–100.0)
Monocytes Absolute: 0.7 K/uL (ref 0.1–1.0)
Monocytes Relative: 10 %
Neutro Abs: 3.9 K/uL (ref 1.7–7.7)
Neutrophils Relative %: 56 %
Platelet Count: 247 K/uL (ref 150–400)
RBC: 4.21 MIL/uL (ref 3.87–5.11)
RDW: 14.5 % (ref 11.5–15.5)
WBC Count: 7 K/uL (ref 4.0–10.5)
nRBC: 0 % (ref 0.0–0.2)

## 2024-11-12 LAB — CMP (CANCER CENTER ONLY)
ALT: 17 U/L (ref 0–44)
AST: 20 U/L (ref 15–41)
Albumin: 4.6 g/dL (ref 3.5–5.0)
Alkaline Phosphatase: 90 U/L (ref 38–126)
Anion gap: 10 (ref 5–15)
BUN: 16 mg/dL (ref 6–20)
CO2: 27 mmol/L (ref 22–32)
Calcium: 9.3 mg/dL (ref 8.9–10.3)
Chloride: 107 mmol/L (ref 98–111)
Creatinine: 0.98 mg/dL (ref 0.44–1.00)
GFR, Estimated: >60 mL/min
Glucose, Bld: 93 mg/dL (ref 70–99)
Potassium: 4 mmol/L (ref 3.5–5.1)
Sodium: 144 mmol/L (ref 135–145)
Total Bilirubin: 0.4 mg/dL (ref 0.0–1.2)
Total Protein: 7.3 g/dL (ref 6.5–8.1)

## 2024-11-12 LAB — IRON AND IRON BINDING CAPACITY (CC-WL,HP ONLY)
Iron: 41 ug/dL (ref 28–170)
Saturation Ratios: 8 % — ABNORMAL LOW (ref 10.4–31.8)
TIBC: 491 ug/dL — ABNORMAL HIGH (ref 250–450)
UIBC: 451 ug/dL

## 2024-11-12 LAB — RETICULOCYTES
Immature Retic Fract: 11.1 % (ref 2.3–15.9)
RBC.: 4.28 MIL/uL (ref 3.87–5.11)
Retic Count, Absolute: 71.5 K/uL (ref 19.0–186.0)
Retic Ct Pct: 1.7 % (ref 0.4–3.1)

## 2024-11-12 LAB — FERRITIN: Ferritin: 13 ng/mL (ref 11–307)

## 2024-11-12 NOTE — Progress Notes (Unsigned)
 Hematology/Oncology Consultation   Name: TAISIA FANTINI      MRN: [id number]    Location: Room/bed info not found  Date: 11/12/2024 Time:4:59 PM   REFERRING PHYSICIAN:  Garnette Olmsted, MD  REASON FOR CONSULT:  Iron deficiency anemia    DIAGNOSIS: Iron deficiency anemia   HISTORY OF PRESENT ILLNESS:  Ms. Schussler is a very pleasant 59 yo caucasian female with history of IDA. She has tried oral iron over the years with no improvement.  She has never done IV iron before.  Iron studies back in September showed a saturation of 3% and ferritin of 4.  She is symptomatic with fatigue, weakness, pounding her her ears, dizziness and brian fog.  She has not noted any obvious blood loss.  She had her EGD and colonoscopy back in 2020.  She had 1 benign esophageal polyp removed, was noted to have a large hiatal hernia and 1 benign polyp removed from the sigmoid colon.  She will be having a follow-up EGD in February.  She has GERD with severe reflux and takes pepcid  daily.  She has history of B 12 deficiency as well and does an injection as indicated with PCP.  No known familial history of anemia.  She has 2 adult kids, no history of miscarriage.  She had a partial hysterectomy for uterine polyp.  No personal or known familial history of cancer.  No history of diabetes or thyroid  disease.  No fever, chills, n/v, cough, rash, SOB, palpitations, abdominal pain or changes in bladder or bowel habits.  She has history of IBS-C.  No swelling, numbness or tingling in her extremities.  No falls or syncope reported.  Appetite and hydration are good. She does not eat red meat. Weight is stable at 207 lbs.  No smoking or recreational drug use.  Rare ETOH socially.  She works as an print production planner.   ROS: All other 10 point review of systems is negative.   PAST MEDICAL HISTORY:   Past Medical History:  Diagnosis Date   Anxiety    PONV (postoperative nausea and vomiting)    Scoliosis      ALLERGIES: Allergies[1]    MEDICATIONS:  Medications Ordered Prior to Encounter[2]   PAST SURGICAL HISTORY Past Surgical History:  Procedure Laterality Date   CERVICAL DISC SURGERY  10/24/2013   COLONOSCOPY  12/04/2018   per Dr. Horacio, benign polyp, repeat in 10 yrs   HAND SURGERY  as a child and in 11/2009    x 2, Left wrist surgery with plates and screws   LAPAROSCOPIC SUPRACERVICAL HYSTERECTOMY  11/29/2011   Procedure: LAPAROSCOPIC SUPRACERVICAL HYSTERECTOMY;  Surgeon: Krystal JONETTA Horacio, MD;  Location: [facility];  Service: Gynecology;  Laterality: N/A;   SPINE SURGERY     SVD     x 2   WISDOM TOOTH EXTRACTION      FAMILY HISTORY: Family History  Problem Relation Age of Onset   Hypertension Mother    Stroke Mother    Alzheimer's disease Father     SOCIAL HISTORY:  reports that she has never smoked. She has never used smokeless tobacco. She reports current alcohol use. She reports that she does not use drugs.  PERFORMANCE STATUS: The patient's performance status is 1 - Symptomatic but completely ambulatory  PHYSICAL EXAM: Most Recent Vital Signs: Blood pressure (!) 141/92, pulse 82, temperature 97.9 F (36.6 C), temperature source Oral, resp. rate 19, height 5' 4 (1.626 m), weight 207 lb 12.8 oz (94.3 kg), last menstrual  period 11/16/2011, SpO2 98%. BP (!) 141/92 (BP Location: Left Arm, Patient Position: Sitting)   Pulse 82   Temp 97.9 F (36.6 C) (Oral)   Resp 19   Ht 5' 4 (1.626 m)   Wt 207 lb 12.8 oz (94.3 kg)   LMP 11/16/2011   SpO2 98%   BMI 35.67 kg/m   General Appearance:    Alert, cooperative, no distress, appears stated age  Head:    Normocephalic, without obvious abnormality, atraumatic  Eyes:    PERRL, conjunctiva/corneas clear, EOM's intact, fundi    benign, both eyes        Throat:   Lips, mucosa, and tongue normal; teeth and gums normal  Neck:   Supple, symmetrical, trachea midline, no adenopathy;    thyroid :  no  enlargement/tenderness/nodules; no carotid   bruit or JVD  Back:     Symmetric, no curvature, ROM normal, no CVA tenderness  Lungs:     Clear to auscultation bilaterally, respirations unlabored  Chest Wall:    No tenderness or deformity   Heart:    Regular rate and rhythm, S1 and S2 normal, no murmur, rub   or gallop     Abdomen:     Soft, non-tender, bowel sounds active all four quadrants,    no masses, no organomegaly        Extremities:   Extremities normal, atraumatic, no cyanosis or edema  Pulses:   2+ and symmetric all extremities  Skin:   Skin color, texture, turgor normal, no rashes or lesions  Lymph nodes:   Cervical, supraclavicular, and axillary nodes normal  Neurologic:   CNII-XII intact, normal strength, sensation and reflexes    throughout    LABORATORY DATA:  Results for orders placed or performed in visit on 11/12/24 (from the past 48 hours)  CBC with Differential (Cancer Center Only)     Status: Abnormal   Collection Time: 11/12/24  3:11 PM  Result Value Ref Range   WBC Count 7.0 4.0 - 10.5 K/uL   RBC 4.21 3.87 - 5.11 MIL/uL   Hemoglobin 11.9 (L) 12.0 - 15.0 g/dL   HCT 63.4 63.9 - 53.9 %   MCV 86.7 80.0 - 100.0 fL   MCH 28.3 26.0 - 34.0 pg   MCHC 32.6 30.0 - 36.0 g/dL   RDW 85.4 88.4 - 84.4 %   Platelet Count 247 150 - 400 K/uL   nRBC 0.0 0.0 - 0.2 %   Neutrophils Relative % 56 %   Neutro Abs 3.9 1.7 - 7.7 K/uL   Lymphocytes Relative 31 %   Lymphs Abs 2.2 0.7 - 4.0 K/uL   Monocytes Relative 10 %   Monocytes Absolute 0.7 0.1 - 1.0 K/uL   Eosinophils Relative 2 %   Eosinophils Absolute 0.2 0.0 - 0.5 K/uL   Basophils Relative 0 %   Basophils Absolute 0.0 0.0 - 0.1 K/uL   Immature Granulocytes 1 %   Abs Immature Granulocytes 0.05 0.00 - 0.07 K/uL    Comment: Performed at [facility], [address] [place], [place] [address]  CMP (Cancer Center only)     Status: None   Collection Time: 11/12/24  3:11 PM  Result Value Ref Range   Sodium 144  135 - 145 mmol/L   Potassium 4.0 3.5 - 5.1 mmol/L   Chloride 107 98 - 111 mmol/L   CO2 27 22 - 32 mmol/L   Glucose, Bld 93 70 - 99 mg/dL    Comment:  Glucose reference range applies only to samples taken after fasting for at least 8 hours.   BUN 16 6 - 20 mg/dL   Creatinine 9.01 9.55 - 1.00 mg/dL   Calcium  9.3 8.9 - 10.3 mg/dL   Total Protein 7.3 6.5 - 8.1 g/dL   Albumin 4.6 3.5 - 5.0 g/dL   AST 20 15 - 41 U/L   ALT 17 0 - 44 U/L   Alkaline Phosphatase 90 38 - 126 U/L   Total Bilirubin 0.4 0.0 - 1.2 mg/dL   GFR, Estimated >39 >39 mL/min    Comment: (NOTE) Calculated using the CKD-EPI Creatinine Equation (2021)    Anion gap 10 5 - 15    Comment: Performed at [facility], [address] [place], [place] [address]  Reticulocytes     Status: None   Collection Time: 11/12/24  3:12 PM  Result Value Ref Range   Retic Ct Pct 1.7 0.4 - 3.1 %   RBC. 4.28 3.87 - 5.11 MIL/uL   Retic Count, Absolute 71.5 19.0 - 186.0 K/uL   Immature Retic Fract 11.1 2.3 - 15.9 %    Comment: Performed at [facility], [address] [place], [place] [address]      RADIOGRAPHY: No results found.     PATHOLOGY: None  ASSESSMENT/PLAN: Ms. Platt is a very pleasant 58 yo caucasian female with history of IDA. We will get her set up for 3 doses of IV iron.  Follow-up in 8 weeks.   All questions were answered. The patient knows to call the clinic with any problems, questions or concerns. We can certainly see the patient much sooner if necessary.   Lauraine Pepper, NP           [1]  Allergies Allergen Reactions   Lisinopril  Cough   Sulfacetamide Sodium Hives  [2]  Current Outpatient Medications on File Prior to Visit  Medication Sig Dispense Refill   acetaminophen  (TYLENOL ) 500 MG tablet Take 500 mg by mouth every 6 (six) hours as needed for mild pain.     amLODipine  (NORVASC ) 5 MG tablet Take 1 tablet (5 mg total) by mouth daily. 90 tablet 3   atorvastatin  (LIPITOR) 10  MG tablet Take 1 tablet (10 mg total) by mouth daily. 90 tablet 3   cyanocobalamin  (VITAMIN B12) 1000 MCG/ML injection Inject 1 ml into the muscle once a week for 12 weeks 6 mL 11   famotidine  (PEPCID ) 40 MG tablet Take 1 tablet (40 mg total) by mouth daily. 90 tablet 3   ferrous sulfate  325 (65 FE) MG tablet Take 1 tablet (325 mg total) by mouth daily.     SYRINGE-NEEDLE, DISP, 3 ML (BD SAFETYGLIDE SYRINGE/NEEDLE) 25G X 1 3 ML MISC Use for B12 injections 100 each 3   No current facility-administered medications on file prior to visit.

## 2024-11-13 ENCOUNTER — Encounter: Payer: Self-pay | Admitting: Family

## 2024-11-13 LAB — ERYTHROPOIETIN: Erythropoietin: 14.6 m[IU]/mL (ref 2.6–18.5)

## 2024-11-21 ENCOUNTER — Inpatient Hospital Stay

## 2024-11-21 VITALS — BP 126/72 | HR 75 | Temp 98.0°F | Resp 18

## 2024-11-21 DIAGNOSIS — D509 Iron deficiency anemia, unspecified: Secondary | ICD-10-CM | POA: Diagnosis not present

## 2024-11-21 MED ORDER — IRON SUCROSE 300 MG IVPB - SIMPLE MED
300.0000 mg | Freq: Once | Status: AC
  Administered 2024-11-21: 300 mg via INTRAVENOUS
  Filled 2024-11-21: qty 300

## 2024-11-21 MED ORDER — SODIUM CHLORIDE 0.9 % IV SOLN: INTRAVENOUS | Status: DC

## 2024-11-21 NOTE — Patient Instructions (Signed)

## 2024-11-21 NOTE — Progress Notes (Signed)
 Patient received IV iron , completed and BP checked prior to discharge 89/49. Pt denies any headaches or dizziness, running additional IV fluids and will recheck BP.   BP recheck 126/72, patient denies headache or dizziness, okay for discharge.

## 2024-11-28 ENCOUNTER — Inpatient Hospital Stay

## 2024-11-28 VITALS — BP 129/76 | HR 76 | Resp 19

## 2024-11-28 DIAGNOSIS — D509 Iron deficiency anemia, unspecified: Secondary | ICD-10-CM

## 2024-11-28 MED ORDER — DIPHENHYDRAMINE HCL 50 MG/ML IJ SOLN: 50.0000 mg | Freq: Once | INTRAMUSCULAR | Status: DC | PRN

## 2024-11-28 MED ORDER — SODIUM CHLORIDE 0.9% FLUSH: 10.0000 mL | Freq: Once | INTRAVENOUS | Status: DC | PRN

## 2024-11-28 MED ORDER — EPINEPHRINE 0.3 MG/0.3ML IJ SOAJ: 0.3000 mg | Freq: Once | INTRAMUSCULAR | Status: DC | PRN

## 2024-11-28 MED ORDER — METHYLPREDNISOLONE SODIUM SUCC 125 MG IJ SOLR: 125.0000 mg | Freq: Once | INTRAMUSCULAR | Status: DC | PRN

## 2024-11-28 MED ORDER — SODIUM CHLORIDE 0.9 % IV SOLN: Freq: Once | INTRAVENOUS | Status: DC | PRN

## 2024-11-28 MED ORDER — SODIUM CHLORIDE 0.9 % IV SOLN: INTRAVENOUS | Status: DC

## 2024-11-28 MED ORDER — IRON SUCROSE 300 MG IVPB - SIMPLE MED
300.0000 mg | Freq: Once | Status: AC
  Administered 2024-11-28: 300 mg via INTRAVENOUS
  Filled 2024-11-28: qty 300

## 2024-11-28 MED ORDER — FAMOTIDINE IN NACL 20-0.9 MG/50ML-% IV SOLN: 20.0000 mg | Freq: Once | INTRAVENOUS | Status: DC | PRN

## 2024-11-28 MED ORDER — ALBUTEROL SULFATE HFA 108 (90 BASE) MCG/ACT IN AERS: 2.0000 | INHALATION_SPRAY | Freq: Once | RESPIRATORY_TRACT | Status: DC | PRN

## 2024-11-28 MED ORDER — SODIUM CHLORIDE 0.9% FLUSH: 3.0000 mL | Freq: Once | INTRAVENOUS | Status: DC | PRN

## 2024-11-28 NOTE — Patient Instructions (Signed)
 Iron  Sucrose Injection What is this medication? IRON  SUCROSE (EYE ern SOO krose) treats low levels of iron  (iron  deficiency anemia) in people with kidney disease. Iron  is a mineral that plays an important role in making red blood cells, which carry oxygen from your lungs to the rest of your body. This medicine may be used for other purposes; ask your health care provider or pharmacist if you have questions. COMMON BRAND NAME(S): Venofer  What should I tell my care team before I take this medication? They need to know if you have any of these conditions: Anemia not caused by low iron  levels Heart disease High levels of iron  in the blood Kidney disease Liver disease An unusual or allergic reaction to iron , other medications, foods, dyes, or preservatives Pregnant or trying to get pregnant Breastfeeding How should I use this medication? This medication is infused into a vein. It is given by your care team in a hospital or clinic setting. Talk to your care team about the use of this medication in children. While it may be prescribed for children as young as 2 years for selected conditions, precautions do apply. Overdosage: If you think you have taken too much of this medicine contact a poison control center or emergency room at once. NOTE: This medicine is only for you. Do not share this medicine with others. What if I miss a dose? Keep appointments for follow-up doses. It is important not to miss your dose. Call your care team if you are unable to keep an appointment. What may interact with this medication? Do not take this medication with any of the following: Deferoxamine Dimercaprol Other iron  products This medication may also interact with the following: Chloramphenicol Deferasirox This list may not describe all possible interactions. Give your health care provider a list of all the medicines, herbs, non-prescription drugs, or dietary supplements you use. Also tell them if you smoke,  drink alcohol, or use illegal drugs. Some items may interact with your medicine. What should I watch for while using this medication? Your condition will be monitored carefully while you are receiving this medication. Tell your care team if your symptoms do not start to get better or if they get worse. You may need blood work done while you are taking this medication. Sometimes, when medications are infused into veins, a little can leak out of the vein and into the tissue around it. If this medication leaks, it can cause a brown or dark stain on the skin. This is not common. It may be permanent. If you feel pain or swelling during your infusion, tell your care team right away. They can stop the infusion and treat the area. You may need to eat more foods that contain iron . Talk to your care team. Foods that contain iron  include whole grains or cereals, dried fruits, beans, peas, leafy green vegetables, and organ meats (liver, kidney). What side effects may I notice from receiving this medication? Side effects that you should report to your care team as soon as possible: Allergic reactions--skin rash, itching, hives, swelling of the face, lips, tongue, or throat Low blood pressure--dizziness, feeling faint or lightheaded, blurry vision Painful swelling, warmth, or redness of the skin, brown or dark skin color at the infusion site Shortness of breath Side effects that usually do not require medical attention (report these to your care team if they continue or are bothersome): Flushing Headache Joint pain Muscle pain Nausea This list may not describe all possible side effects. Call your  doctor for medical advice about side effects. You may report side effects to FDA at 1-800-FDA-1088. Where should I keep my medication? This medication is given in a hospital or clinic. It will not be stored at home. NOTE: This sheet is a summary. It may not cover all possible information. If you have questions about  this medicine, talk to your doctor, pharmacist, or health care provider.  2025 [employer] (2024-08-28 00:00:00)

## 2024-12-05 ENCOUNTER — Inpatient Hospital Stay

## 2025-01-06 ENCOUNTER — Inpatient Hospital Stay

## 2025-01-06 ENCOUNTER — Inpatient Hospital Stay: Admitting: Family

## 2025-01-10 ENCOUNTER — Inpatient Hospital Stay: Admitting: Family

## 2025-01-10 ENCOUNTER — Inpatient Hospital Stay
